# Patient Record
Sex: Female | Born: 1995 | Hispanic: No | Marital: Married | Smoking: Never smoker
Health system: Southern US, Community
[De-identification: ages and names within clinical notes are randomized; demographics above are authoritative.]

## PROBLEM LIST (undated history)

## (undated) DIAGNOSIS — J121 Respiratory syncytial virus pneumonia: Secondary | ICD-10-CM

## (undated) DIAGNOSIS — B360 Pityriasis versicolor: Secondary | ICD-10-CM

## (undated) HISTORY — PX: WISDOM TOOTH EXTRACTION: SHX21

## (undated) HISTORY — DX: Respiratory syncytial virus pneumonia: J12.1

## (undated) HISTORY — DX: Pityriasis versicolor: B36.0

## (undated) HISTORY — PX: KIDNEY SURGERY: SHX687

---
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
## 2013-05-08 ENCOUNTER — Encounter: Payer: Self-pay | Admitting: Gynecology

## 2013-05-08 ENCOUNTER — Ambulatory Visit (INDEPENDENT_AMBULATORY_CARE_PROVIDER_SITE_OTHER): Payer: Commercial Managed Care - PPO | Admitting: Gynecology

## 2013-05-08 DIAGNOSIS — N912 Amenorrhea, unspecified: Secondary | ICD-10-CM

## 2013-05-08 LAB — HCG, SERUM, QUALITATIVE: Preg, Serum: NEGATIVE

## 2013-05-08 NOTE — Patient Instructions (Signed)
Followup for blood pregnancy test results. If negative then restart your birth control pills this Sunday.

## 2013-05-08 NOTE — Progress Notes (Signed)
Patient presents having missed her last menses. LMP 04/01/2013. Was supposed to start beginning of this week. Had been on oral contraceptives but did not restart her pack. 2 home pregnancy test negative. No bleeding or cramping.  Exam was Administrator, [employer] vagina normal. Cervix normal. Uterus retroverted grossly normal in size but mobile nontender. Adnexa without masses or tenderness.  Assessment and plan: Missed menses denies any missed BCPs. Using condoms as backup. Check qualitative hCG. Assuming negative then restart new pack of pills this Sunday. Stressed the need to continue condoms to help decrease STD and backup contraception.

## 2013-06-21 ENCOUNTER — Ambulatory Visit (INDEPENDENT_AMBULATORY_CARE_PROVIDER_SITE_OTHER): Payer: Commercial Managed Care - PPO | Admitting: Gynecology

## 2013-06-21 ENCOUNTER — Encounter: Payer: Self-pay | Admitting: Gynecology

## 2013-06-21 VITALS — BP 120/80 | Ht 59.0 in | Wt 179.0 lb

## 2013-06-21 DIAGNOSIS — Z113 Encounter for screening for infections with a predominantly sexual mode of transmission: Secondary | ICD-10-CM

## 2013-06-21 DIAGNOSIS — Z01419 Encounter for gynecological examination (general) (routine) without abnormal findings: Secondary | ICD-10-CM

## 2013-06-21 LAB — URINALYSIS W MICROSCOPIC + REFLEX CULTURE
BILIRUBIN URINE: NEGATIVE
CASTS: NONE SEEN
Crystals: NONE SEEN
GLUCOSE, UA: NEGATIVE mg/dL
Ketones, ur: NEGATIVE mg/dL
Nitrite: POSITIVE — AB
PROTEIN: NEGATIVE mg/dL
Specific Gravity, Urine: 1.015 (ref 1.005–1.030)
Urobilinogen, UA: 0.2 mg/dL (ref 0.0–1.0)
pH: 6 (ref 5.0–8.0)

## 2013-06-21 MED ORDER — NORETHIN ACE-ETH ESTRAD-FE 1-20 MG-MCG PO TABS: 1.0000 | ORAL_TABLET | Freq: Every day | ORAL | Status: DC

## 2013-06-21 NOTE — Patient Instructions (Signed)
Followup in one year for annual exam, sooner as needed. 

## 2013-06-21 NOTE — Progress Notes (Signed)
Andres EgeSarah C Frandock May 16, 1995 [id number]        18 y.o.  G0P0 for annual exam.  Doing well without complaints.  Past medical history,surgical history, problem list, medications, allergies, family history and social history were all reviewed and documented in the EPIC chart.  ROS:  Performed and pertinent positives and negatives are included in the history, assessment and plan .  Exam: Kim assistant Filed Vitals:   06/21/13 1554  BP: 120/80  Height: 4\' 11"  (1.499 m)  Weight: 179 lb (81.194 kg)   General appearance  Normal Skin grossly normal Head/Neck normal with no cervical or supraclavicular adenopathy thyroid normal Lungs  clear Cardiac RR, without RMG Abdominal  soft, nontender, without masses, organomegaly or hernia Breasts  examined lying and sitting without masses, retractions, discharge or axillary adenopathy. Pelvic  Ext/BUS/vagina  Normal  Cervix  Normal. GC/Chlamydia screen done  Uterus  anteverted, normal size, shape and contour, midline and mobile nontender   Adnexa  Without masses or tenderness    Anus and perineum  Normal      Assessment/Plan:  18 y.o. G0P0 female for annual exam regular menses, oral contraceptives..   1. Patient doing well on her Loestrin 120 equivalent. Refill x1 year provided. 2. STD screening. GC/Chlamydia done. Need to continue condoms regardless to help decrease STD risk reviewed. 3. Gardasil series received. 4. Pap smear deferred until 21 per current screening guidelines. 5. Breast health. SBE monthly reviewed. 6. Health maintenance. No routine lab work done other than urinalysis. Followup in one year, sooner as needed.   Note: This document was prepared with digital dictation and possible smart phrase technology. Any transcriptional errors that result from this process are unintentional.   Dara LordsFONTAINE,Arlie Posch P MD, 4:54 PM 06/21/2013

## 2013-06-22 LAB — GC/CHLAMYDIA PROBE AMP
CT Probe RNA: NEGATIVE
GC PROBE AMP APTIMA: NEGATIVE

## 2013-06-24 ENCOUNTER — Other Ambulatory Visit: Payer: Self-pay | Admitting: Gynecology

## 2013-06-24 DIAGNOSIS — N390 Urinary tract infection, site not specified: Secondary | ICD-10-CM

## 2013-06-24 LAB — URINE CULTURE: Colony Count: >=100000

## 2013-06-24 MED ORDER — SULFAMETHOXAZOLE-TMP DS 800-160 MG PO TABS: 1.0000 | ORAL_TABLET | Freq: Two times a day (BID) | ORAL | Status: DC

## 2013-07-19 ENCOUNTER — Other Ambulatory Visit: Payer: Self-pay

## 2013-07-19 DIAGNOSIS — Z01419 Encounter for gynecological examination (general) (routine) without abnormal findings: Secondary | ICD-10-CM

## 2013-07-19 MED ORDER — NORETHIN ACE-ETH ESTRAD-FE 1-20 MG-MCG PO TABS
1.0000 | ORAL_TABLET | Freq: Every day | ORAL | Status: DC
Start: 2013-07-19 — End: 2013-08-16

## 2013-08-16 ENCOUNTER — Other Ambulatory Visit: Payer: Self-pay

## 2013-08-16 DIAGNOSIS — Z01419 Encounter for gynecological examination (general) (routine) without abnormal findings: Secondary | ICD-10-CM

## 2013-08-16 MED ORDER — NORETHIN ACE-ETH ESTRAD-FE 1-20 MG-MCG PO TABS: 1.0000 | ORAL_TABLET | Freq: Every day | ORAL | Status: DC

## 2013-09-13 ENCOUNTER — Other Ambulatory Visit: Payer: Self-pay

## 2013-09-13 DIAGNOSIS — Z01419 Encounter for gynecological examination (general) (routine) without abnormal findings: Secondary | ICD-10-CM

## 2013-09-13 MED ORDER — NORETHIN ACE-ETH ESTRAD-FE 1-20 MG-MCG PO TABS: 1.0000 | ORAL_TABLET | Freq: Every day | ORAL | Status: DC

## 2013-10-11 ENCOUNTER — Other Ambulatory Visit: Payer: Self-pay

## 2013-10-11 DIAGNOSIS — Z01419 Encounter for gynecological examination (general) (routine) without abnormal findings: Secondary | ICD-10-CM

## 2013-10-11 MED ORDER — NORETHIN ACE-ETH ESTRAD-FE 1-20 MG-MCG PO TABS: 1.0000 | ORAL_TABLET | Freq: Every day | ORAL | Status: DC

## 2013-11-11 ENCOUNTER — Other Ambulatory Visit: Payer: Self-pay

## 2013-11-11 DIAGNOSIS — Z01419 Encounter for gynecological examination (general) (routine) without abnormal findings: Secondary | ICD-10-CM

## 2013-11-11 MED ORDER — NORETHIN ACE-ETH ESTRAD-FE 1-20 MG-MCG PO TABS: 1.0000 | ORAL_TABLET | Freq: Every day | ORAL | Status: DC

## 2014-07-18 ENCOUNTER — Encounter: Payer: Self-pay | Admitting: Gynecology

## 2014-07-18 ENCOUNTER — Ambulatory Visit (INDEPENDENT_AMBULATORY_CARE_PROVIDER_SITE_OTHER): Payer: Commercial Managed Care - PPO | Admitting: Gynecology

## 2014-07-18 VITALS — BP 124/80 | Ht 59.0 in | Wt 189.0 lb

## 2014-07-18 DIAGNOSIS — Z01419 Encounter for gynecological examination (general) (routine) without abnormal findings: Secondary | ICD-10-CM | POA: Diagnosis not present

## 2014-07-18 DIAGNOSIS — Z113 Encounter for screening for infections with a predominantly sexual mode of transmission: Secondary | ICD-10-CM

## 2014-07-18 DIAGNOSIS — R21 Rash and other nonspecific skin eruption: Secondary | ICD-10-CM

## 2014-07-18 LAB — COMPREHENSIVE METABOLIC PANEL
ALBUMIN: 4.1 g/dL (ref 3.5–5.2)
ALK PHOS: 57 U/L (ref 39–117)
ALT: <8 U/L (ref 0–35)
AST: 11 U/L (ref 0–37)
BILIRUBIN TOTAL: 0.4 mg/dL (ref 0.2–1.2)
BUN: 12 mg/dL (ref 6–23)
CO2: 23 mEq/L (ref 19–32)
Calcium: 9 mg/dL (ref 8.4–10.5)
Chloride: 105 mEq/L (ref 96–112)
Creat: 0.63 mg/dL (ref 0.50–1.10)
GLUCOSE: 93 mg/dL (ref 70–99)
Potassium: 4.6 mEq/L (ref 3.5–5.3)
Sodium: 139 mEq/L (ref 135–145)
Total Protein: 6.7 g/dL (ref 6.0–8.3)

## 2014-07-18 LAB — CBC WITH DIFFERENTIAL/PLATELET
Basophils Absolute: 0 10*3/uL (ref 0.0–0.1)
Basophils Relative: 0 % (ref 0–1)
EOS ABS: 0.3 10*3/uL (ref 0.0–0.7)
EOS PCT: 3 % (ref 0–5)
HCT: 43.4 % (ref 36.0–46.0)
Hemoglobin: 14.3 g/dL (ref 12.0–15.0)
LYMPHS ABS: 2.3 10*3/uL (ref 0.7–4.0)
Lymphocytes Relative: 27 % (ref 12–46)
MCH: 25.6 pg — ABNORMAL LOW (ref 26.0–34.0)
MCHC: 32.9 g/dL (ref 30.0–36.0)
MCV: 77.8 fL — ABNORMAL LOW (ref 78.0–100.0)
MONOS PCT: 4 % (ref 3–12)
MPV: 9.5 fL (ref 8.6–12.4)
Monocytes Absolute: 0.3 10*3/uL (ref 0.1–1.0)
Neutro Abs: 5.5 10*3/uL (ref 1.7–7.7)
Neutrophils Relative %: 66 % (ref 43–77)
Platelets: 378 10*3/uL (ref 150–400)
RBC: 5.58 MIL/uL — ABNORMAL HIGH (ref 3.87–5.11)
RDW: 14.6 % (ref 11.5–15.5)
WBC: 8.4 10*3/uL (ref 4.0–10.5)

## 2014-07-18 LAB — WET PREP FOR TRICH, YEAST, CLUE
CLUE CELLS WET PREP: NONE SEEN
TRICH WET PREP: NONE SEEN

## 2014-07-18 MED ORDER — NORETHIN ACE-ETH ESTRAD-FE 1-20 MG-MCG PO TABS: 1.0000 | ORAL_TABLET | Freq: Every day | ORAL | Status: DC

## 2014-07-18 MED ORDER — FLUCONAZOLE 150 MG PO TABS: 150.0000 mg | ORAL_TABLET | Freq: Every day | ORAL | Status: DC

## 2014-07-18 MED ORDER — BETAMETHASONE DIPROPIONATE 0.05 % EX CREA: TOPICAL_CREAM | Freq: Two times a day (BID) | CUTANEOUS | Status: DC

## 2014-07-18 NOTE — Progress Notes (Signed)
Andres EgeSarah C Frandock 06-27-1995 [id number]        19 y.o.  G0P0 for annual exam.  Several issues noted below.  Past medical history,surgical history, problem list, medications, allergies, family history and social history were all reviewed and documented as reviewed in the EPIC chart.  ROS:  Performed with pertinent positives and negatives included in the history, assessment and plan.   Additional significant findings :  none   Exam: [employer] [facility] personassistant Filed Vitals:   07/18/14 1054  BP: 124/80  Height: 4\' 11"  (1.499 m)  Weight: 189 lb (85.73 kg)   General appearance:  Normal affect, orientation and appearance. Skin: Grossly normal HEENT: Without gross lesions.  No cervical or supraclavicular adenopathy. Thyroid normal.  Lungs:  Clear without wheezing, rales or rhonchi Cardiac: RR, without RMG Abdominal:  Soft, nontender, without masses, guarding, rebound, organomegaly or hernia Breasts:  Examined lying and sitting without masses, retractions, discharge or axillary adenopathy. Pelvic:  Ext/BUS/vagina with raised erythematous rash right groin crease. White vaginal discharge noted.  Cervix normal. GC/Chlamydia  Uterus axial, normal size, shape and contour, midline and mobile nontender   Adnexa  Without masses or tenderness    Anus and perineum  Normal     Assessment/Plan:  19 y.o. G0P0 female for annual exam with regular menses, oral contraceptives.   1. Birth control.  Patient continues on Loestrin 120 equivalent doing well. Did have questions about alternatives. I reviewed all forms of birth control with her to include pill patch ring Nexplanon Depo-Provera IUDs.  She has 2 friends using the Mirena IUD doing well with this. I reviewed the Mirena IUD with her, the insertional process, the risks and side effects. Issues of infection in the liver's patients also reviewed. Literature provided and patient is going to decide if she wants to pursue this. I did refill her Loestrin 120 equivalent 1  year. 2. Vulvar irritation. Well-defined erythematous rash right groin crease. Does have white discharge with positive yeast on wet prep. We'll treat with Diflucan 150 mg 3 days to cover skin involvement and Diprolene 0.05% cream twice a day 1-2 weeks. Assuming rash totally resolves patient will follow expectantly. If rash persists she knows to follow up for further evaluation. 3. STD screening. GC/chlamydia screen done. No known exposure. 4. Pap smear. Will start next year at age 19. 375. Gardasil series reportedly received. 6. Breast health. SBE monthly reviewed. 7. Health maintenance. Baseline CBC company has a metabolic panel urinalysis ordered. Follow up in one year, sooner if she decides to pursue Mirena IUD.     Dara LordsFONTAINE,Morton Simson P MD, 11:23 AM 07/18/2014

## 2014-07-18 NOTE — Patient Instructions (Signed)
Take the Diflucan pill daily for 3 days. Apply the steroid cream twice daily to the rash area. If the rash persists then call me. If it totally resolves them we will follow.  Follow up if you decide to pursue the IUD.  You may obtain a copy of any labs that were done today by logging onto MyChart as outlined in the instructions provided with your AVS (after visit summary). The office will not call with normal lab results but certainly if there are any significant abnormalities then we will contact you.   Health Maintenance, Female A healthy lifestyle and preventative care can promote health and wellness.  Maintain regular health, dental, and eye exams.  Eat a healthy diet. Foods like vegetables, fruits, whole grains, low-fat dairy products, and lean protein foods contain the nutrients you need without too many calories. Decrease your intake of foods high in solid fats, added sugars, and salt. Get information about a proper diet from your caregiver, if necessary.  Regular physical exercise is one of the most important things you can do for your health. Most adults should get at least 150 minutes of moderate-intensity exercise (any activity that increases your heart rate and causes you to sweat) each week. In addition, most adults need muscle-strengthening exercises on 2 or more days a week.   Maintain a healthy weight. The body mass index (BMI) is a screening tool to identify possible weight problems. It provides an estimate of body fat based on height and weight. Your caregiver can help determine your BMI, and can help you achieve or maintain a healthy weight. For adults 20 years and older:  A BMI below 18.5 is considered underweight.  A BMI of 18.5 to 24.9 is normal.  A BMI of 25 to 29.9 is considered overweight.  A BMI of 30 and above is considered obese.  Maintain normal blood lipids and cholesterol by exercising and minimizing your intake of saturated fat. Eat a balanced diet with  plenty of fruits and vegetables. Blood tests for lipids and cholesterol should begin at age 28 and be repeated every 5 years. If your lipid or cholesterol levels are high, you are over 50, or you are a high risk for heart disease, you may need your cholesterol levels checked more frequently.Ongoing high lipid and cholesterol levels should be treated with medicines if diet and exercise are not effective.  If you smoke, find out from your caregiver how to quit. If you do not use tobacco, do not start.  Lung cancer screening is recommended for adults aged 3 80 years who are at high risk for developing lung cancer because of a history of smoking. Yearly low-dose computed tomography (CT) is recommended for people who have at least a 30-pack-year history of smoking and are a current smoker or have quit within the past 15 years. A pack year of smoking is smoking an average of 1 pack of cigarettes a day for 1 year (for example: 1 pack a day for 30 years or 2 packs a day for 15 years). Yearly screening should continue until the smoker has stopped smoking for at least 15 years. Yearly screening should also be stopped for people who develop a health problem that would prevent them from having lung cancer treatment.  If you are pregnant, do not drink alcohol. If you are breastfeeding, be very cautious about drinking alcohol. If you are not pregnant and choose to drink alcohol, do not exceed 1 drink per day. One drink is considered  to be 12 ounces (355 mL) of beer, 5 ounces (148 mL) of wine, or 1.5 ounces (44 mL) of liquor.  Avoid use of street drugs. Do not share needles with anyone. Ask for help if you need support or instructions about stopping the use of drugs.  High blood pressure causes heart disease and increases the risk of stroke. Blood pressure should be checked at least every 1 to 2 years. Ongoing high blood pressure should be treated with medicines, if weight loss and exercise are not effective.  If you  are 58 to 21 years old, ask your caregiver if you should take aspirin to prevent strokes.  Diabetes screening involves taking a blood sample to check your fasting blood sugar level. This should be done once every 3 years, after age 53, if you are within normal weight and without risk factors for diabetes. Testing should be considered at a younger age or be carried out more frequently if you are overweight and have at least 1 risk factor for diabetes.  Breast cancer screening is essential preventative care for women. You should practice "breast self-awareness." This means understanding the normal appearance and feel of your breasts and may include breast self-examination. Any changes detected, no matter how small, should be reported to a caregiver. Women in their 33s and 30s should have a clinical breast exam (CBE) by a caregiver as part of a regular health exam every 1 to 3 years. After age 56, women should have a CBE every year. Starting at age 51, women should consider having a mammogram (breast X-ray) every year. Women who have a family history of breast cancer should talk to their caregiver about genetic screening. Women at a high risk of breast cancer should talk to their caregiver about having an MRI and a mammogram every year.  Breast cancer gene (BRCA)-related cancer risk assessment is recommended for women who have family members with BRCA-related cancers. BRCA-related cancers include breast, ovarian, tubal, and peritoneal cancers. Having family members with these cancers may be associated with an increased risk for harmful changes (mutations) in the breast cancer genes BRCA1 and BRCA2. Results of the assessment will determine the need for genetic counseling and BRCA1 and BRCA2 testing.  The Pap test is a screening test for cervical cancer. Women should have a Pap test starting at age 30. Between ages 25 and 29, Pap tests should be repeated every 2 years. Beginning at age 37, you should have a Pap  test every 3 years as long as the past 3 Pap tests have been normal. If you had a hysterectomy for a problem that was not cancer or a condition that could lead to cancer, then you no longer need Pap tests. If you are between ages 20 and 67, and you have had normal Pap tests going back 10 years, you no longer need Pap tests. If you have had past treatment for cervical cancer or a condition that could lead to cancer, you need Pap tests and screening for cancer for at least 20 years after your treatment. If Pap tests have been discontinued, risk factors (such as a new sexual partner) need to be reassessed to determine if screening should be resumed. Some women have medical problems that increase the chance of getting cervical cancer. In these cases, your caregiver may recommend more frequent screening and Pap tests.  The human papillomavirus (HPV) test is an additional test that may be used for cervical cancer screening. The HPV test looks for the virus  that can cause the cell changes on the cervix. The cells collected during the Pap test can be tested for HPV. The HPV test could be used to screen women aged 14 years and older, and should be used in women of any age who have unclear Pap test results. After the age of 44, women should have HPV testing at the same frequency as a Pap test.  Colorectal cancer can be detected and often prevented. Most routine colorectal cancer screening begins at the age of 77 and continues through age 34. However, your caregiver may recommend screening at an earlier age if you have risk factors for colon cancer. On a yearly basis, your caregiver may provide home test kits to check for hidden blood in the stool. Use of a small camera at the end of a tube, to directly examine the colon (sigmoidoscopy or colonoscopy), can detect the earliest forms of colorectal cancer. Talk to your caregiver about this at age 60, when routine screening begins. Direct examination of the colon should be  repeated every 5 to 10 years through age 78, unless early forms of pre-cancerous polyps or small growths are found.  Hepatitis C blood testing is recommended for all people born from [birth year] through 1967 and any individual with known risks for hepatitis C.  Practice safe sex. Use condoms and avoid high-risk sexual practices to reduce the spread of sexually transmitted infections (STIs). Sexually active women aged 35 and younger should be checked for Chlamydia, which is a common sexually transmitted infection. Older women with new or multiple partners should also be tested for Chlamydia. Testing for other STIs is recommended if you are sexually active and at increased risk.  Osteoporosis is a disease in which the bones lose minerals and strength with aging. This can result in serious bone fractures. The risk of osteoporosis can be identified using a bone density scan. Women ages 20 and over and women at risk for fractures or osteoporosis should discuss screening with their caregivers. Ask your caregiver whether you should be taking a calcium supplement or vitamin D to reduce the rate of osteoporosis.  Menopause can be associated with physical symptoms and risks. Hormone replacement therapy is available to decrease symptoms and risks. You should talk to your caregiver about whether hormone replacement therapy is right for you.  Use sunscreen. Apply sunscreen liberally and repeatedly throughout the day. You should seek shade when your shadow is shorter than you. Protect yourself by wearing long sleeves, pants, a wide-brimmed hat, and sunglasses year round, whenever you are outdoors.  Notify your caregiver of new moles or changes in moles, especially if there is a change in shape or color. Also notify your caregiver if a mole is larger than the size of a pencil eraser.  Stay current with your immunizations. Document Released: 11/08/2010 Document Revised: 08/20/2012 Document Reviewed: 11/08/2010 [facility]  Patient Information 2014 [place].

## 2014-07-19 LAB — URINALYSIS W MICROSCOPIC + REFLEX CULTURE
Bilirubin Urine: NEGATIVE
CRYSTALS: NONE SEEN
Casts: NONE SEEN
GLUCOSE, UA: NEGATIVE mg/dL
Ketones, ur: NEGATIVE mg/dL
Nitrite: POSITIVE — AB
Protein, ur: NEGATIVE mg/dL
Specific Gravity, Urine: 1.023 (ref 1.005–1.030)
Urobilinogen, UA: 0.2 mg/dL (ref 0.0–1.0)
pH: 5.5 (ref 5.0–8.0)

## 2014-07-19 LAB — GC/CHLAMYDIA PROBE AMP
CT PROBE, AMP APTIMA: NEGATIVE
GC Probe RNA: NEGATIVE

## 2014-07-21 ENCOUNTER — Other Ambulatory Visit: Payer: Self-pay | Admitting: Gynecology

## 2014-07-21 LAB — URINE CULTURE: Colony Count: >=100000

## 2014-07-21 MED ORDER — SULFAMETHOXAZOLE-TRIMETHOPRIM 800-160 MG PO TABS: 1.0000 | ORAL_TABLET | Freq: Two times a day (BID) | ORAL | Status: DC

## 2014-09-30 ENCOUNTER — Other Ambulatory Visit: Payer: Self-pay | Admitting: *Deleted

## 2014-09-30 DIAGNOSIS — R8271 Bacteriuria: Secondary | ICD-10-CM

## 2014-10-03 ENCOUNTER — Other Ambulatory Visit: Payer: Commercial Managed Care - PPO

## 2014-10-03 DIAGNOSIS — R8271 Bacteriuria: Secondary | ICD-10-CM

## 2014-10-04 LAB — URINALYSIS W MICROSCOPIC + REFLEX CULTURE
BILIRUBIN URINE: NEGATIVE
CASTS: NONE SEEN
Crystals: NONE SEEN
Glucose, UA: NEGATIVE mg/dL
Ketones, ur: NEGATIVE mg/dL
LEUKOCYTES UA: NEGATIVE
Nitrite: POSITIVE — AB
PROTEIN: NEGATIVE mg/dL
SPECIFIC GRAVITY, URINE: 1.014 (ref 1.005–1.030)
SQUAMOUS EPITHELIAL / LPF: NONE SEEN
UROBILINOGEN UA: 0.2 mg/dL (ref 0.0–1.0)
pH: 5.5 (ref 5.0–8.0)

## 2014-10-06 LAB — URINE CULTURE: Colony Count: >=100000

## 2014-10-07 ENCOUNTER — Other Ambulatory Visit: Payer: Self-pay | Admitting: *Deleted

## 2014-10-07 MED ORDER — CIPROFLOXACIN HCL 250 MG PO TABS: 250.0000 mg | ORAL_TABLET | Freq: Two times a day (BID) | ORAL | Status: DC

## 2015-01-19 ENCOUNTER — Telehealth: Payer: Self-pay

## 2015-01-19 NOTE — Telephone Encounter (Signed)
Patient called in voice mail. She states "everytime I come to the gynecologist I have a UTI".  She complained of yesterday her "kidney was swollen, warm in the spot where my right kidney is".  She asked if she should go to urologist or come here.  I called her back and left detailed message in voice mail per [facility] access note. I advised her that if she has urologist certainly that would be fine to go and see the urologist. I told her if she is not established with urologist the issue is she will need a referral and sometimes new patient appointments can take a few weeks.  In that case she could come to [facility] for assessment. I advised her Dr. Adaline Sill off today but Dr. Glenetta Hew could see her.  I told her to call back and speak with the appointment desk if she would like to come in or to call me back if any questions.

## 2015-01-23 ENCOUNTER — Encounter: Payer: Self-pay | Admitting: Gynecology

## 2015-01-23 ENCOUNTER — Ambulatory Visit (INDEPENDENT_AMBULATORY_CARE_PROVIDER_SITE_OTHER): Payer: Commercial Managed Care - PPO | Admitting: Gynecology

## 2015-01-23 VITALS — BP 118/76

## 2015-01-23 DIAGNOSIS — R3915 Urgency of urination: Secondary | ICD-10-CM

## 2015-01-23 DIAGNOSIS — N898 Other specified noninflammatory disorders of vagina: Secondary | ICD-10-CM | POA: Diagnosis not present

## 2015-01-23 LAB — WET PREP FOR TRICH, YEAST, CLUE: Trich, Wet Prep: NONE SEEN

## 2015-01-23 LAB — URINALYSIS W MICROSCOPIC + REFLEX CULTURE
BILIRUBIN URINE: NEGATIVE
CRYSTALS: NONE SEEN [HPF]
Casts: NONE SEEN [LPF]
GLUCOSE, UA: NEGATIVE
KETONES UR: NEGATIVE
LEUKOCYTES UA: NEGATIVE
Nitrite: NEGATIVE
PH: 7 (ref 5.0–8.0)
Protein, ur: NEGATIVE
Specific Gravity, Urine: 1.02 (ref 1.001–1.035)
Yeast: NONE SEEN [HPF]

## 2015-01-23 MED ORDER — METRONIDAZOLE 500 MG PO TABS: 500.0000 mg | ORAL_TABLET | Freq: Two times a day (BID) | ORAL | Status: DC

## 2015-01-23 MED ORDER — FLUCONAZOLE 150 MG PO TABS
150.0000 mg | ORAL_TABLET | Freq: Once | ORAL | Status: DC
Start: 2015-01-23 — End: 2015-02-17

## 2015-01-23 NOTE — Progress Notes (Signed)
LORMA HEATER 11-01-1995 [id number]        19 y.o.  G0P0 Patient presents with several days of urinary urgency and urinary odor. Also some low back pain. No fever or chills nausea vomiting frequency or dysuria. Seems to happen frequently and she wonders whether she is having frequent urinary tract infections. She has a friend who takes postcoital antibiotics wonders whether she should do this.  Past medical history,surgical history, problem list, medications, allergies, family history and social history were all reviewed and documented in the EPIC chart.  Directed ROS with pertinent positives and negatives documented in the history of present illness/assessment and plan.  Exam: Kim assistant Filed Vitals:   01/23/15 1529  BP: 118/76   General appearance:  Normal Spine straight without CVA tenderness Abdomen soft nontender without masses guarding rebound Pelvic external BUS vagina with white frothy discharge. Cervix normal. Uterus normal size midline mobile nontender. Adnexa without masses or tenderness.  Assessment/Plan:  19 y.o. G0P0 with symptoms as above. Wet prep does show yeast and few clue cells. Will cover for yeast and bacterial vaginosis with Diflucan 150 mg 1 and Flagyl 500 mg twice a day 7, alcohol avoidance reviewed. Urinalysis shows few bacteria. Will await culture and treat if positive. If urinary symptoms persist and urine culture negative will refer to urology rule out interstitial cystitis. She does have a history also of kidney/bladder surgery as a child. If culture positive then we'll treat for UTI and try postcoital antibiotics to see if this doesn't suppress recurrences. If she fails this we will refer to urology.    Dara Lords MD, 3:57 PM 01/23/2015

## 2015-01-23 NOTE — Patient Instructions (Signed)
Take the one Diflucan pill. Take the Flagyl medication twice daily for 7 days. Avoid alcohol while taking. Call if your urinary symptoms continue and I may refer you to urology.

## 2015-01-25 LAB — URINE CULTURE: Colony Count: >=100000

## 2015-01-26 ENCOUNTER — Other Ambulatory Visit: Payer: Self-pay | Admitting: Gynecology

## 2015-01-26 MED ORDER — NITROFURANTOIN MONOHYD MACRO 100 MG PO CAPS: ORAL_CAPSULE | ORAL | Status: DC

## 2015-02-17 ENCOUNTER — Ambulatory Visit (INDEPENDENT_AMBULATORY_CARE_PROVIDER_SITE_OTHER): Payer: Commercial Managed Care - PPO | Admitting: Gynecology

## 2015-02-17 ENCOUNTER — Encounter: Payer: Self-pay | Admitting: Gynecology

## 2015-02-17 VITALS — BP 120/76

## 2015-02-17 DIAGNOSIS — N926 Irregular menstruation, unspecified: Secondary | ICD-10-CM | POA: Diagnosis not present

## 2015-02-17 DIAGNOSIS — N390 Urinary tract infection, site not specified: Secondary | ICD-10-CM

## 2015-02-17 LAB — URINALYSIS W MICROSCOPIC + REFLEX CULTURE
BILIRUBIN URINE: NEGATIVE
Bacteria, UA: NONE SEEN [HPF]
CRYSTALS: NONE SEEN [HPF]
Casts: NONE SEEN [LPF]
GLUCOSE, UA: NEGATIVE
Hgb urine dipstick: NEGATIVE
Ketones, ur: NEGATIVE
LEUKOCYTES UA: NEGATIVE
Nitrite: NEGATIVE
Protein, ur: NEGATIVE
RBC / HPF: NONE SEEN RBC/HPF (ref <=2)
SPECIFIC GRAVITY, URINE: 1.01 (ref 1.001–1.035)
WBC UA: NONE SEEN WBC/HPF (ref <=5)
Yeast: NONE SEEN [HPF]
pH: 7 (ref 5.0–8.0)

## 2015-02-17 MED ORDER — NORETHINDRONE-ETH ESTRADIOL 1-35 MG-MCG PO TABS: 1.0000 | ORAL_TABLET | Freq: Every day | ORAL | Status: DC

## 2015-02-17 NOTE — Patient Instructions (Signed)
Start on the new birth control pills. If irregular bleeding continues after the second pack call the office. If it regulates and everything is fine then follow up when you're due for your annual exam.

## 2015-02-17 NOTE — Progress Notes (Signed)
Brooke Haley Aug 28, 1995 [id number]        19 y.o.  G0P0 presents complaining of breakthrough bleeding into the third week of her birth control pills for 3 months. Does have regular menses when do but start spotting and having breakthrough bleeding the third week. No pain cramping or other symptoms. Recently treated for UTI with Escherichia coli. Is going to start a postcoital antibiotic regiment.  Past medical history,surgical history, problem list, medications, allergies, family history and social history were all reviewed and documented in the EPIC chart.  Directed ROS with pertinent positives and negatives documented in the history of present illness/assessment and plan.  Exam: Kim assistant Filed Vitals:   02/17/15 1538  BP: 120/76   General appearance:  Normal Abdomen soft nontender without masses guarding rebound Pelvic external BUS vagina normal. Cervix normal. Uterus grossly normal midline mobile nontender. Adnexa without gross masses or tenderness  Assessment/Plan:  19 y.o. G0P0 with breakthrough bleeding third week into the cycle 3 months. Will increase to a higher estrogen dose pill and see if we do not eliminate this. We'll go with Ortho-Novum 1/35 equivalent. Patient will call if she continues to have breakthrough bleeding after 2 packs. Otherwise assuming she does well then she'll follow up when she is due for her annual exam in March 2017.  Will check urine analysis today for test of cure from her recent urinary tract infection.    Dara Lords MD, 3:50 PM 02/17/2015

## 2015-07-21 ENCOUNTER — Encounter: Payer: Commercial Managed Care - PPO | Admitting: Gynecology

## 2015-07-23 ENCOUNTER — Ambulatory Visit (INDEPENDENT_AMBULATORY_CARE_PROVIDER_SITE_OTHER): Payer: Managed Care, Other (non HMO) | Admitting: Gynecology

## 2015-07-23 ENCOUNTER — Encounter: Payer: Self-pay | Admitting: Gynecology

## 2015-07-23 VITALS — BP 116/70 | Ht 60.0 in | Wt 196.0 lb

## 2015-07-23 DIAGNOSIS — Z01419 Encounter for gynecological examination (general) (routine) without abnormal findings: Secondary | ICD-10-CM

## 2015-07-23 DIAGNOSIS — Z113 Encounter for screening for infections with a predominantly sexual mode of transmission: Secondary | ICD-10-CM

## 2015-07-23 LAB — URINALYSIS W MICROSCOPIC + REFLEX CULTURE
Bilirubin Urine: NEGATIVE
CASTS: NONE SEEN [LPF]
Crystals: NONE SEEN [HPF]
Glucose, UA: NEGATIVE
Ketones, ur: NEGATIVE
LEUKOCYTES UA: NEGATIVE
NITRITE: NEGATIVE
Protein, ur: NEGATIVE
SPECIFIC GRAVITY, URINE: 1.02 (ref 1.001–1.035)
YEAST: NONE SEEN [HPF]
pH: 6.5 (ref 5.0–8.0)

## 2015-07-23 LAB — CBC WITH DIFFERENTIAL/PLATELET
BASOS PCT: 0 % (ref 0–1)
Basophils Absolute: 0 10*3/uL (ref 0.0–0.1)
EOS ABS: 0.2 10*3/uL (ref 0.0–0.7)
EOS PCT: 2 % (ref 0–5)
HCT: 42.1 % (ref 36.0–46.0)
HEMOGLOBIN: 13.9 g/dL (ref 12.0–15.0)
Lymphocytes Relative: 31 % (ref 12–46)
Lymphs Abs: 2.4 10*3/uL (ref 0.7–4.0)
MCH: 25.9 pg — AB (ref 26.0–34.0)
MCHC: 33 g/dL (ref 30.0–36.0)
MCV: 78.4 fL (ref 78.0–100.0)
MONO ABS: 0.5 10*3/uL (ref 0.1–1.0)
MONOS PCT: 6 % (ref 3–12)
MPV: 9.5 fL (ref 8.6–12.4)
NEUTROS ABS: 4.8 10*3/uL (ref 1.7–7.7)
Neutrophils Relative %: 61 % (ref 43–77)
PLATELETS: 385 10*3/uL (ref 150–400)
RBC: 5.37 MIL/uL — AB (ref 3.87–5.11)
RDW: 14 % (ref 11.5–15.5)
WBC: 7.9 10*3/uL (ref 4.0–10.5)

## 2015-07-23 LAB — COMPREHENSIVE METABOLIC PANEL
ALT: 9 U/L (ref 6–29)
AST: 14 U/L (ref 10–30)
Albumin: 4.1 g/dL (ref 3.6–5.1)
Alkaline Phosphatase: 45 U/L (ref 33–115)
BILIRUBIN TOTAL: 0.4 mg/dL (ref 0.2–1.2)
BUN: 12 mg/dL (ref 7–25)
CHLORIDE: 104 mmol/L (ref 98–110)
CO2: 22 mmol/L (ref 20–31)
CREATININE: 0.56 mg/dL (ref 0.50–1.10)
Calcium: 9.1 mg/dL (ref 8.6–10.2)
GLUCOSE: 79 mg/dL (ref 65–99)
Potassium: 4.7 mmol/L (ref 3.5–5.3)
SODIUM: 140 mmol/L (ref 135–146)
Total Protein: 7.1 g/dL (ref 6.1–8.1)

## 2015-07-23 MED ORDER — SULFAMETHOXAZOLE-TRIMETHOPRIM 800-160 MG PO TABS: 1.0000 | ORAL_TABLET | Freq: Two times a day (BID) | ORAL | Status: DC

## 2015-07-23 MED ORDER — NORETHINDRONE-ETH ESTRADIOL 1-35 MG-MCG PO TABS: 1.0000 | ORAL_TABLET | Freq: Every day | ORAL | Status: DC

## 2015-07-23 NOTE — Progress Notes (Signed)
    Brooke Haley [birth year] [id number]        20 y.o.  G0P0  for annual exam.  Several issues noted below.  Past medical history,surgical history, problem list, medications, allergies, family history and social history were all reviewed and documented as reviewed in the EPIC chart.  ROS:  Performed with pertinent positives and negatives included in the history, assessment and plan.   Additional significant findings :  none   Exam: Kennon PortelaKim Gardner assistant Filed Vitals:   07/23/15 1240  BP: 116/70  Height: 5' (1.524 m)  Weight: 196 lb (88.905 kg)   General appearance:  Normal affect, orientation and appearance. Skin: Grossly normal HEENT: Without gross lesions.  No cervical or supraclavicular adenopathy. Thyroid normal.  Lungs:  Clear without wheezing, rales or rhonchi Cardiac: RR, without RMG Abdominal:  Soft, nontender, without masses, guarding, rebound, organomegaly or hernia Breasts:  Examined lying and sitting without masses, retractions, discharge or axillary adenopathy. Pelvic:  Ext/BUS/vagina normal  Cervix normal. Pap smear done, GC/chlamydia  Uterus anteverted, normal size, shape and contour, midline and mobile nontender   Adnexa without masses or tenderness    Anus and perineum normal    Assessment/Plan:  20 y.o. G0P0 female for annual exam with regular menses, oral contraceptives.   1. Oral contraceptives. Patient doing well with her Ortho-Novum 1/35 equipment lengths. Wants to continue.  Refill 1 year provided. 2. Intermittent dysuria over the last week or so. Has been pushing cranberry juice. History of UTIs in the past. Urinalysis contaminated but does show white cells and bacteria. Will cover with Septra DS 1 by mouth 3 times a day 3 days. Follow up on culture. 3. Pap smear. First Pap smear done today as she is turned 19 per current screening guidelines. 4. STD screening. GC/Chlamydia done. Need for condoms regardless of birth control pills to help reduce STD  risk. 5. Breast health. SBE monthly reviewed. 6. Gardasil series reportedly received. 7. Health maintenance. CBC, CMP, urinalysis ordered. Follow up in one year, sooner as needed.   Dara LordsFONTAINE,Tabitha Riggins P MD, 1:04 PM 07/23/2015

## 2015-07-23 NOTE — Patient Instructions (Signed)
Take the oral antibiotic twice daily for 3 days.  Follow-up if your symptoms persist, worsen or recur. 

## 2015-07-24 LAB — PAP IG W/ RFLX HPV ASCU

## 2015-07-24 LAB — GC/CHLAMYDIA PROBE AMP
CT PROBE, AMP APTIMA: NOT DETECTED
GC PROBE AMP APTIMA: NOT DETECTED

## 2015-07-25 LAB — URINE CULTURE

## 2015-11-19 ENCOUNTER — Ambulatory Visit: Payer: Managed Care, Other (non HMO) | Admitting: Gynecology

## 2015-12-04 ENCOUNTER — Ambulatory Visit: Payer: Managed Care, Other (non HMO) | Admitting: Gynecology

## 2016-07-05 DIAGNOSIS — R350 Frequency of micturition: Secondary | ICD-10-CM | POA: Diagnosis not present

## 2016-07-10 ENCOUNTER — Other Ambulatory Visit: Payer: Self-pay | Admitting: Gynecology

## 2016-07-13 DIAGNOSIS — B348 Other viral infections of unspecified site: Secondary | ICD-10-CM | POA: Diagnosis not present

## 2016-08-04 ENCOUNTER — Ambulatory Visit (INDEPENDENT_AMBULATORY_CARE_PROVIDER_SITE_OTHER): Payer: BLUE CROSS/BLUE SHIELD | Admitting: Gynecology

## 2016-08-04 ENCOUNTER — Encounter: Payer: Self-pay | Admitting: Gynecology

## 2016-08-04 ENCOUNTER — Other Ambulatory Visit: Payer: Self-pay | Admitting: Gynecology

## 2016-08-04 VITALS — BP 126/80 | Ht 59.25 in | Wt 197.0 lb

## 2016-08-04 DIAGNOSIS — Z01419 Encounter for gynecological examination (general) (routine) without abnormal findings: Secondary | ICD-10-CM

## 2016-08-04 DIAGNOSIS — Z113 Encounter for screening for infections with a predominantly sexual mode of transmission: Secondary | ICD-10-CM | POA: Diagnosis not present

## 2016-08-04 LAB — CBC WITH DIFFERENTIAL/PLATELET
BASOS PCT: 0 %
Basophils Absolute: 0 cells/uL (ref 0–200)
EOS ABS: 112 {cells}/uL (ref 15–500)
Eosinophils Relative: 2 %
HEMATOCRIT: 42.3 % (ref 35.0–45.0)
Hemoglobin: 14 g/dL (ref 11.7–15.5)
LYMPHS ABS: 1232 {cells}/uL (ref 850–3900)
Lymphocytes Relative: 22 %
MCH: 25.9 pg — ABNORMAL LOW (ref 27.0–33.0)
MCHC: 33.1 g/dL (ref 32.0–36.0)
MCV: 78.2 fL — AB (ref 80.0–100.0)
MONO ABS: 728 {cells}/uL (ref 200–950)
MPV: 9.5 fL (ref 7.5–12.5)
Monocytes Relative: 13 %
NEUTROS ABS: 3528 {cells}/uL (ref 1500–7800)
Neutrophils Relative %: 63 %
Platelets: 340 10*3/uL (ref 140–400)
RBC: 5.41 MIL/uL — ABNORMAL HIGH (ref 3.80–5.10)
RDW: 14.3 % (ref 11.0–15.0)
WBC: 5.6 10*3/uL (ref 3.8–10.8)

## 2016-08-04 LAB — GLUCOSE, RANDOM: Glucose, Bld: 88 mg/dL (ref 65–99)

## 2016-08-04 MED ORDER — NORETHINDRONE-ETH ESTRADIOL 1-35 MG-MCG PO TABS: 1.0000 | ORAL_TABLET | Freq: Every day | ORAL | 12 refills | Status: DC

## 2016-08-04 NOTE — Patient Instructions (Signed)

## 2016-08-04 NOTE — Progress Notes (Signed)
    Andres EgeSarah C Frandock April 05, 1996 [id number]        21 y.o.  G0P0 for annual exam.    Past medical history,surgical history, problem list, medications, allergies, family history and social history were all reviewed and documented as reviewed in the EPIC chart.  ROS:  Performed with pertinent positives and negatives included in the history, assessment and plan.   Additional significant findings :  None   Exam: Biomedical scientistBlanca assistant Vitals:   08/04/16 1538  BP: 126/80  Weight: 197 lb (89.4 kg)  Height: 4' 11.25" (1.505 m)   Body mass index is 39.45 kg/m.  General appearance:  Normal affect, orientation and appearance. Skin: Grossly normal HEENT: Without gross lesions.  No cervical or supraclavicular adenopathy. Thyroid normal.  Lungs:  Clear without wheezing, rales or rhonchi Cardiac: RR, without RMG Abdominal:  Soft, nontender, without masses, guarding, rebound, organomegaly or hernia Breasts:  Examined lying and sitting without masses, retractions, discharge or axillary adenopathy. Pelvic:  Ext, BUS, Vagina: Normal  Cervix: Normal. GC/Chlamydia  Uterus: Anteverted, normal size, shape and contour, midline and mobile nontender   Adnexa: Without masses or tenderness    Anus and perineum: Normal    Assessment/Plan:  21 y.o. G0P0 female for annual exam with regular menses, oral contraceptives.   1. Continues on Ortho-Novum 1/35 equivalent. Doing well and wants to continue. Refill 1 year provided. 2. Pap smear 2017 normal. No history of abnormal Pap smears. Plan repeat Pap smear at 3 year interval per current screening guidelines. 3. STD screening. GC/Chlamydia done at her request. No known exposure. Declined serum screening. Condoms regardless reviewed. 4. Breast health. SBE monthly reviewed. 5. Gardasil series reportedly received. 6. Health maintenance. Baseline CBC, glucose, urinalysis ordered. Follow up 1 year, sooner as needed   Dara LordsFONTAINE,Eamon Tantillo P MD, 3:57 PM  08/04/2016

## 2016-08-05 LAB — URINALYSIS W MICROSCOPIC + REFLEX CULTURE
Bilirubin Urine: NEGATIVE
CRYSTALS: NONE SEEN [HPF]
Casts: NONE SEEN [LPF]
Glucose, UA: NEGATIVE
KETONES UR: NEGATIVE
NITRITE: POSITIVE — AB
PH: 5.5 (ref 5.0–8.0)
SQUAMOUS EPITHELIAL / LPF: NONE SEEN [HPF] (ref <=5)
Specific Gravity, Urine: 1.015 (ref 1.001–1.035)
WBC, UA: >60 WBC/HPF — AB (ref <=5)
Yeast: NONE SEEN [HPF]

## 2016-08-05 LAB — GC/CHLAMYDIA PROBE AMP
CT PROBE, AMP APTIMA: NOT DETECTED
GC PROBE AMP APTIMA: NOT DETECTED

## 2016-08-07 LAB — URINE CULTURE

## 2016-08-08 ENCOUNTER — Other Ambulatory Visit: Payer: Self-pay | Admitting: Gynecology

## 2016-08-08 MED ORDER — SULFAMETHOXAZOLE-TRIMETHOPRIM 800-160 MG PO TABS: 1.0000 | ORAL_TABLET | Freq: Two times a day (BID) | ORAL | 0 refills | Status: DC

## 2016-10-31 ENCOUNTER — Encounter: Payer: Self-pay | Admitting: Family Medicine

## 2016-10-31 ENCOUNTER — Ambulatory Visit (INDEPENDENT_AMBULATORY_CARE_PROVIDER_SITE_OTHER): Payer: BLUE CROSS/BLUE SHIELD | Admitting: Family Medicine

## 2016-10-31 VITALS — BP 98/70 | HR 89 | Temp 98.7°F | Ht 59.0 in | Wt 208.5 lb

## 2016-10-31 DIAGNOSIS — R1011 Right upper quadrant pain: Secondary | ICD-10-CM

## 2016-10-31 DIAGNOSIS — Z7189 Other specified counseling: Secondary | ICD-10-CM

## 2016-10-31 LAB — COMPREHENSIVE METABOLIC PANEL
ALK PHOS: 48 U/L (ref 39–117)
ALT: 9 U/L (ref 0–35)
AST: 12 U/L (ref 0–37)
Albumin: 4.1 g/dL (ref 3.5–5.2)
BUN: 10 mg/dL (ref 6–23)
CHLORIDE: 105 meq/L (ref 96–112)
CO2: 25 mEq/L (ref 19–32)
Calcium: 9.5 mg/dL (ref 8.4–10.5)
Creatinine, Ser: 0.59 mg/dL (ref 0.40–1.20)
GFR: 134.73 mL/min (ref >60.00)
GLUCOSE: 87 mg/dL (ref 70–99)
POTASSIUM: 3.7 meq/L (ref 3.5–5.1)
SODIUM: 138 meq/L (ref 135–145)
Total Bilirubin: 0.5 mg/dL (ref 0.2–1.2)
Total Protein: 7 g/dL (ref 6.0–8.3)

## 2016-10-31 NOTE — Progress Notes (Signed)
New patient.  To re-est care.   She isn't lightheaded on standing.    College grad, d/w pt.  She is looking for work at this point.    Off and on sx.  She has some sharp episodic pain in the R upper abdomen, along the lower ribs.  Not in her back.  No L sided pain.  No FCNAVD.  Has been going on for about 1 year.  Unclear if being on her feet at work makes a difference.  The area can be locally puffy.  No dysuria.  No stool changes.  No suprapubic pain. No changes with eating.    LMP was 4 weeks ago, due to start this week.  Hasn't missed a period.    D/w pt about UTI dx and infection vs colonization.  D/w pt about tx only if dysuria.  Prev tx for UTI didn't make a difference in the pain described above.   Surgical history noted, discussed with patient.  Advance directive discussed with patient. She would have her father designated if she were incapacitated.  PMH and SH reviewed  ROS: Per HPI unless specifically indicated in ROS section   Meds, vitals, and allergies reviewed.   GEN: nad, alert and oriented HEENT: mucous membranes moist NECK: supple w/o LA CV: rrr. PULM: ctab, no inc wob ABD: soft, +bs, RUQ not ttp, murphy neg EXT: no edema SKIN: no acute rash No cva pain

## 2016-10-31 NOTE — Patient Instructions (Addendum)
Go to the lab on the way out.  We'll contact you with your lab report. Bonita QuinLinda will call about your referral, see her on the way out.  Take care.  Glad to see you.  Update me as needed.

## 2016-11-01 ENCOUNTER — Encounter: Payer: Self-pay | Admitting: Family Medicine

## 2016-11-01 ENCOUNTER — Ambulatory Visit
Admission: RE | Admit: 2016-11-01 | Discharge: 2016-11-01 | Disposition: A | Discharge status: Home / Self Care | Payer: BLUE CROSS/BLUE SHIELD | Source: Ambulatory Visit | Attending: Family Medicine | Admitting: Family Medicine

## 2016-11-01 DIAGNOSIS — R1011 Right upper quadrant pain: Secondary | ICD-10-CM | POA: Insufficient documentation

## 2016-11-01 DIAGNOSIS — Z7189 Other specified counseling: Secondary | ICD-10-CM | POA: Insufficient documentation

## 2016-11-01 DIAGNOSIS — N261 Atrophy of kidney (terminal): Secondary | ICD-10-CM | POA: Diagnosis not present

## 2016-11-01 NOTE — Assessment & Plan Note (Signed)
Advance directive discussed with patient. She would have her father designated if she were incapacitated.

## 2016-11-01 NOTE — Assessment & Plan Note (Signed)
>  30 minutes spent in face to face time with patient, >50% spent in counselling or coordination of care.  Discussed with patient about symptoms, differential diagnosis, and plan. Possible that she could have gallstones or gallbladder pathology causing right upper quadrant pain. She could have symptoms related to previous ureter intervention in childhood. Reasonable to check abdominal ultrasound and basic labs today. Okay for outpatient follow-up.  See above regarding discussion of UTI, which is not likely given that she has no dysuria. She could have had episodic colonization with bacteria in the past.

## 2016-11-02 ENCOUNTER — Other Ambulatory Visit: Payer: Self-pay | Admitting: Family Medicine

## 2016-11-02 DIAGNOSIS — R1011 Right upper quadrant pain: Secondary | ICD-10-CM

## 2016-11-04 ENCOUNTER — Telehealth: Payer: Self-pay | Admitting: Family Medicine

## 2016-11-04 NOTE — Telephone Encounter (Signed)
Patient returned Lugene's call. °

## 2016-11-07 ENCOUNTER — Telehealth: Payer: Self-pay | Admitting: Family Medicine

## 2016-11-07 DIAGNOSIS — R1011 Right upper quadrant pain: Secondary | ICD-10-CM

## 2016-11-07 NOTE — Telephone Encounter (Signed)
Please change order for NM hepatobili  to [id number] [facility] scheduling thanks

## 2016-11-07 NOTE — Telephone Encounter (Signed)
Done.  Thanks.  Old order removed.

## 2016-11-17 ENCOUNTER — Encounter (HOSPITAL_COMMUNITY): Payer: BLUE CROSS/BLUE SHIELD

## 2016-11-21 DIAGNOSIS — I776 Arteritis, unspecified: Secondary | ICD-10-CM | POA: Diagnosis not present

## 2016-11-21 DIAGNOSIS — L958 Other vasculitis limited to the skin: Secondary | ICD-10-CM | POA: Diagnosis not present

## 2016-11-21 DIAGNOSIS — R21 Rash and other nonspecific skin eruption: Secondary | ICD-10-CM | POA: Diagnosis not present

## 2016-11-21 DIAGNOSIS — Z79899 Other long term (current) drug therapy: Secondary | ICD-10-CM | POA: Diagnosis not present

## 2016-11-21 DIAGNOSIS — M310 Hypersensitivity angiitis: Secondary | ICD-10-CM | POA: Diagnosis not present

## 2016-11-21 DIAGNOSIS — D693 Immune thrombocytopenic purpura: Secondary | ICD-10-CM | POA: Diagnosis not present

## 2016-11-28 ENCOUNTER — Ambulatory Visit (HOSPITAL_COMMUNITY)
Admission: RE | Admit: 2016-11-28 | Discharge: 2016-11-28 | Disposition: A | Discharge status: Home / Self Care | Payer: BLUE CROSS/BLUE SHIELD | Source: Ambulatory Visit | Attending: Family Medicine | Admitting: Family Medicine

## 2016-11-28 DIAGNOSIS — R1011 Right upper quadrant pain: Secondary | ICD-10-CM

## 2016-11-28 MED ORDER — TECHNETIUM TC 99M MEBROFENIN IV KIT
5.0000 | PACK | Freq: Once | INTRAVENOUS | Status: AC | PRN
  Administered 2016-11-28: 5 via INTRAVENOUS

## 2016-12-13 DIAGNOSIS — M310 Hypersensitivity angiitis: Secondary | ICD-10-CM | POA: Diagnosis not present

## 2017-07-04 ENCOUNTER — Other Ambulatory Visit: Payer: Self-pay | Admitting: Gynecology

## 2017-08-07 ENCOUNTER — Ambulatory Visit (INDEPENDENT_AMBULATORY_CARE_PROVIDER_SITE_OTHER): Payer: BLUE CROSS/BLUE SHIELD | Admitting: Gynecology

## 2017-08-07 ENCOUNTER — Encounter: Payer: Self-pay | Admitting: Gynecology

## 2017-08-07 VITALS — BP 116/74 | Ht 60.0 in | Wt 202.0 lb

## 2017-08-07 DIAGNOSIS — Z01419 Encounter for gynecological examination (general) (routine) without abnormal findings: Secondary | ICD-10-CM | POA: Diagnosis not present

## 2017-08-07 DIAGNOSIS — Z113 Encounter for screening for infections with a predominantly sexual mode of transmission: Secondary | ICD-10-CM

## 2017-08-07 LAB — COMPREHENSIVE METABOLIC PANEL
AG RATIO: 1.8 (calc) (ref 1.0–2.5)
ALBUMIN MSPROF: 4.4 g/dL (ref 3.6–5.1)
ALKALINE PHOSPHATASE (APISO): 53 U/L (ref 33–115)
ALT: 9 U/L (ref 6–29)
AST: 12 U/L (ref 10–30)
BUN: 9 mg/dL (ref 7–25)
CHLORIDE: 103 mmol/L (ref 98–110)
CO2: 23 mmol/L (ref 20–32)
Calcium: 9.4 mg/dL (ref 8.6–10.2)
Creat: 0.56 mg/dL (ref 0.50–1.10)
GLOBULIN: 2.4 g/dL (ref 1.9–3.7)
Glucose, Bld: 84 mg/dL (ref 65–99)
POTASSIUM: 4 mmol/L (ref 3.5–5.3)
Sodium: 137 mmol/L (ref 135–146)
Total Bilirubin: 0.5 mg/dL (ref 0.2–1.2)
Total Protein: 6.8 g/dL (ref 6.1–8.1)

## 2017-08-07 LAB — CBC WITH DIFFERENTIAL/PLATELET
BASOS PCT: 0.4 %
Basophils Absolute: 51 cells/uL (ref 0–200)
EOS ABS: 115 {cells}/uL (ref 15–500)
Eosinophils Relative: 0.9 %
HCT: 43 % (ref 35.0–45.0)
HEMOGLOBIN: 14.5 g/dL (ref 11.7–15.5)
LYMPHS ABS: 3187 {cells}/uL (ref 850–3900)
MCH: 25.9 pg — AB (ref 27.0–33.0)
MCHC: 33.7 g/dL (ref 32.0–36.0)
MCV: 76.9 fL — AB (ref 80.0–100.0)
MPV: 10.1 fL (ref 7.5–12.5)
Monocytes Relative: 4.8 %
NEUTROS ABS: 8832 {cells}/uL — AB (ref 1500–7800)
Neutrophils Relative %: 69 %
PLATELETS: 403 10*3/uL — AB (ref 140–400)
RBC: 5.59 10*6/uL — AB (ref 3.80–5.10)
RDW: 13.7 % (ref 11.0–15.0)
TOTAL LYMPHOCYTE: 24.9 %
WBC: 12.8 10*3/uL — AB (ref 3.8–10.8)
WBCMIX: 614 {cells}/uL (ref 200–950)

## 2017-08-07 MED ORDER — NORETHINDRONE-ETH ESTRADIOL 1-35 MG-MCG PO TABS: 1.0000 | ORAL_TABLET | Freq: Every day | ORAL | 12 refills | Status: DC

## 2017-08-07 NOTE — Progress Notes (Signed)
    Andres EgeSarah C Frandock 05-29-1995 [id number]        22 y.o.  G0P0 for annual gynecologic exam.  Doing well without complaints.  Past medical history,surgical history, problem list, medications, allergies, family history and social history were all reviewed and documented as reviewed in the EPIC chart.  ROS:  Performed with pertinent positives and negatives included in the history, assessment and plan.   Additional significant findings : None   Exam: Kennon PortelaKim Gardner assistant Vitals:   08/07/17 1504  BP: 116/74  Weight: 202 lb (91.6 kg)  Height: 5' (1.524 m)   Body mass index is 39.45 kg/m.  General appearance:  Normal affect, orientation and appearance. Skin: Grossly normal HEENT: Without gross lesions.  No cervical or supraclavicular adenopathy. Thyroid normal.  Lungs:  Clear without wheezing, rales or rhonchi Cardiac: RR, without RMG Abdominal:  Soft, nontender, without masses, guarding, rebound, organomegaly or hernia Breasts:  Examined lying and sitting without masses, retractions, discharge or axillary adenopathy. Pelvic:  Ext, BUS, Vagina: Normal  Cervix: Normal.  GC/Chlamydia screen  Uterus: Anteverted, normal size, shape and contour, midline and mobile nontender   Adnexa: Without masses or tenderness    Anus and perineum: Normal    Assessment/Plan:  22 y.o. G0P0 female for annual gynecologic exam with regular menses, oral contraceptives.   1. Continues on Ortho-Novum 1/35 equivalents.  Doing well.  Refill times 1 year provided. 2. Breast health.  SBE reviewed.  Breast exam normal today. 3. Pap smear 2017.  No Pap smear done today.  No history of abnormal Pap smears.  Plan repeat Pap smear next year at 3-year interval per current screening guidelines. 4. Gardasil series reportedly received. 5. STD screening.  GC/Chlamydia done.  6. Health maintenance.  Baseline CBC and comprehensive metabolic panel ordered.  Follow-up 1 year, sooner as needed.   Dara Lordsimothy P Devann Cribb MD,  3:31 PM 08/07/2017

## 2017-08-07 NOTE — Patient Instructions (Signed)
Follow-up in 1 year for annual exam, sooner if any issues. 

## 2017-08-08 LAB — C. TRACHOMATIS/N. GONORRHOEAE RNA
C. TRACHOMATIS RNA, TMA: NOT DETECTED
N. GONORRHOEAE RNA, TMA: NOT DETECTED

## 2017-08-09 ENCOUNTER — Other Ambulatory Visit: Payer: Self-pay | Admitting: Gynecology

## 2017-08-09 DIAGNOSIS — D72828 Other elevated white blood cell count: Secondary | ICD-10-CM

## 2017-08-09 DIAGNOSIS — R7989 Other specified abnormal findings of blood chemistry: Secondary | ICD-10-CM

## 2017-09-11 ENCOUNTER — Other Ambulatory Visit: Payer: BLUE CROSS/BLUE SHIELD

## 2017-09-11 DIAGNOSIS — R7989 Other specified abnormal findings of blood chemistry: Secondary | ICD-10-CM

## 2017-09-11 DIAGNOSIS — D72828 Other elevated white blood cell count: Secondary | ICD-10-CM | POA: Diagnosis not present

## 2017-09-11 LAB — CBC WITH DIFFERENTIAL/PLATELET
BASOS ABS: 26 {cells}/uL (ref 0–200)
Basophils Relative: 0.3 %
EOS PCT: 1.8 %
Eosinophils Absolute: 158 cells/uL (ref 15–500)
HEMATOCRIT: 43.2 % (ref 35.0–45.0)
HEMOGLOBIN: 14.3 g/dL (ref 11.7–15.5)
LYMPHS ABS: 2077 {cells}/uL (ref 850–3900)
MCH: 25.9 pg — ABNORMAL LOW (ref 27.0–33.0)
MCHC: 33.1 g/dL (ref 32.0–36.0)
MCV: 78.3 fL — ABNORMAL LOW (ref 80.0–100.0)
MPV: 10.3 fL (ref 7.5–12.5)
Monocytes Relative: 4.6 %
NEUTROS ABS: 6134 {cells}/uL (ref 1500–7800)
Neutrophils Relative %: 69.7 %
Platelets: 415 10*3/uL — ABNORMAL HIGH (ref 140–400)
RBC: 5.52 10*6/uL — ABNORMAL HIGH (ref 3.80–5.10)
RDW: 13.4 % (ref 11.0–15.0)
Total Lymphocyte: 23.6 %
WBC: 8.8 10*3/uL (ref 3.8–10.8)
WBCMIX: 405 {cells}/uL (ref 200–950)

## 2017-09-15 ENCOUNTER — Other Ambulatory Visit: Payer: Self-pay

## 2017-10-25 ENCOUNTER — Encounter: Payer: Self-pay | Admitting: Family Medicine

## 2017-10-25 ENCOUNTER — Ambulatory Visit (INDEPENDENT_AMBULATORY_CARE_PROVIDER_SITE_OTHER): Payer: BLUE CROSS/BLUE SHIELD | Admitting: Family Medicine

## 2017-10-25 VITALS — BP 108/68 | HR 92 | Temp 98.7°F | Ht 59.0 in | Wt 203.0 lb

## 2017-10-25 DIAGNOSIS — D690 Allergic purpura: Secondary | ICD-10-CM

## 2017-10-25 DIAGNOSIS — J029 Acute pharyngitis, unspecified: Secondary | ICD-10-CM | POA: Diagnosis not present

## 2017-10-25 LAB — POCT RAPID STREP A (OFFICE): Rapid Strep A Screen: NEGATIVE

## 2017-10-25 MED ORDER — AMOXICILLIN 875 MG PO TABS: 875.0000 mg | ORAL_TABLET | Freq: Two times a day (BID) | ORAL | 0 refills | Status: DC

## 2017-10-25 NOTE — Progress Notes (Signed)
Started with some aches about 2 days ago.  Went to work yesterday with chills and aches, didn't feel well in general.  Temp 101.2 yesterday.  This AM with 100.7 temp.  Out of work today.  ST today.  Took aleve today.  Less aches today.  Meds, vitals, and allergies reviewed.   ROS: Per HPI unless specifically indicated in ROS section   GEN: nad, alert and oriented HEENT: mucous membranes moist, tm w/o erythema, nasal exam w/o erythema, clear discharge noted,  OP with cobblestoning and L sided exudates NECK: supple w/o LA CV: rrr.   PULM: ctab, no inc wob EXT: no edema SKIN: no acute rash  RST neg, d/w pt at OV.

## 2017-10-25 NOTE — Patient Instructions (Signed)
Presumed strep.  Start amoxil.   If you have a fever then stay out of work.  Drink plenty of fluids, take tylenol as needed, and gargle with warm salt water for your throat.  This should gradually improve.  Take care.  Let us know if you have other concerns.

## 2017-10-26 ENCOUNTER — Other Ambulatory Visit: Payer: Self-pay | Admitting: Family Medicine

## 2017-10-26 ENCOUNTER — Telehealth: Payer: Self-pay | Admitting: Family Medicine

## 2017-10-26 DIAGNOSIS — J029 Acute pharyngitis, unspecified: Secondary | ICD-10-CM | POA: Insufficient documentation

## 2017-10-26 MED ORDER — AMOXICILLIN 875 MG PO TABS: 875.0000 mg | ORAL_TABLET | Freq: Two times a day (BID) | ORAL | 0 refills | Status: AC

## 2017-10-26 MED ORDER — AMOXICILLIN 875 MG PO TABS: 875.0000 mg | ORAL_TABLET | Freq: Two times a day (BID) | ORAL | 0 refills | Status: DC

## 2017-10-26 NOTE — Telephone Encounter (Signed)
Taken care of in another encounter 

## 2017-10-26 NOTE — Telephone Encounter (Signed)
Copied from CRM [id number]. Topic: Quick Communication - Rx Refill/Question >> Oct 26, 2017  9:22 AM Louie BunPalacios Medina, Rosey Batheresa D wrote: Medication: amoxicillin (AMOXIL) 875 MG tablet  Has the patient contacted their pharmacy? Yes, but pharmacy did not received it, if it can be send to her normal pharmacy is [place] please. (Agent: If no, request that the patient contact the pharmacy for the refill.) (Agent: If yes, when and what did the pharmacy advise?)  Preferred Pharmacy (with phone number or street name): [facility] - [place] - [address]  Agent: Please be advised that RX refills may take up to 3 business days. We ask that you follow-up with your pharmacy.

## 2017-10-26 NOTE — Assessment & Plan Note (Signed)
Presumed strep throat.  4 out of 5 criteria met.  Possible false negative strep test. Fever, ST, absence of cough, L sided exudates.  Start amoxicillin.  Routine cautions given.  Update me as needed. She agrees.

## 2017-10-26 NOTE — Progress Notes (Signed)
Patient needed rx resent.  Done.  Printed and given to patient.

## 2018-08-07 ENCOUNTER — Other Ambulatory Visit: Payer: Self-pay | Admitting: Gynecology

## 2018-08-07 NOTE — Telephone Encounter (Signed)
Annual scheduled on 08/24/18

## 2018-08-09 ENCOUNTER — Encounter: Payer: BLUE CROSS/BLUE SHIELD | Admitting: Gynecology

## 2018-08-14 ENCOUNTER — Other Ambulatory Visit: Payer: Self-pay

## 2018-08-15 ENCOUNTER — Other Ambulatory Visit: Payer: Self-pay | Admitting: *Deleted

## 2018-08-15 ENCOUNTER — Encounter: Payer: Self-pay | Admitting: Gynecology

## 2018-08-15 ENCOUNTER — Encounter: Payer: BLUE CROSS/BLUE SHIELD | Admitting: Gynecology

## 2018-08-15 ENCOUNTER — Ambulatory Visit (INDEPENDENT_AMBULATORY_CARE_PROVIDER_SITE_OTHER): Payer: BLUE CROSS/BLUE SHIELD | Admitting: Gynecology

## 2018-08-15 VITALS — BP 120/74 | Ht 60.0 in | Wt 198.0 lb

## 2018-08-15 DIAGNOSIS — Z113 Encounter for screening for infections with a predominantly sexual mode of transmission: Secondary | ICD-10-CM | POA: Diagnosis not present

## 2018-08-15 DIAGNOSIS — R8290 Unspecified abnormal findings in urine: Secondary | ICD-10-CM

## 2018-08-15 DIAGNOSIS — Z01419 Encounter for gynecological examination (general) (routine) without abnormal findings: Secondary | ICD-10-CM | POA: Diagnosis not present

## 2018-08-15 MED ORDER — SULFAMETHOXAZOLE-TRIMETHOPRIM 800-160 MG PO TABS: 1.0000 | ORAL_TABLET | Freq: Two times a day (BID) | ORAL | 0 refills | Status: DC

## 2018-08-15 MED ORDER — NORETHINDRONE-ETH ESTRADIOL 1-35 MG-MCG PO TABS: 1.0000 | ORAL_TABLET | Freq: Every day | ORAL | 4 refills | Status: DC

## 2018-08-15 NOTE — Patient Instructions (Signed)
Follow-up in 1 year for annual exam, sooner if any issues. 

## 2018-08-15 NOTE — Progress Notes (Signed)
    Brooke Haley [birth year] [id number]        23 y.o.  G0P0 for annual gynecologic exam.  Doing well without gynecologic complaints  Past medical history,surgical history, problem list, medications, allergies, family history and social history were all reviewed and documented as reviewed in the EPIC chart.  ROS:  Performed with pertinent positives and negatives included in the history, assessment and plan.   Additional significant findings : None   Exam: Kennon Portela assistant Vitals:   08/15/18 0927  BP: 120/74  Weight: 198 lb (89.8 kg)  Height: 5' (1.524 m)   Body mass index is 38.67 kg/m.  General appearance:  Normal affect, orientation and appearance. Skin: Grossly normal HEENT: Without gross lesions.  No cervical or supraclavicular adenopathy. Thyroid normal.  Lungs:  Clear without wheezing, rales or rhonchi Cardiac: RR, without RMG Abdominal:  Soft, nontender, without masses, guarding, rebound, organomegaly or hernia Breasts:  Examined lying and sitting without masses, retractions, discharge or axillary adenopathy. Pelvic:  Ext, BUS, Vagina: Normal  Cervix: Normal.  Pap smear, GC/chlamydia  Uterus: Anteverted, normal size, shape and contour, midline and mobile nontender   Adnexa: Without masses or tenderness    Anus and perineum: Normal    Assessment/Plan:  23 y.o. G0P0 female for annual gynecologic exam.  With regular menses, oral contraceptives  1. Oral contraceptives.  Doing well on 1/35 equivalent.  Refill x1 year provided. 2. Urine odor.  Patient noted several weeks ago increased odor in her urine and some mild dysuria.  Used OTC Azo and that her symptoms resolved although now over the last day or so she notes a slight odor to her urine.  No dysuria frequency urgency.  Will check baseline urine analysis.  Push fluids for now. 3. STD screening discussed.  GC/chlamydia screen done.  Continue condoms regardless of being on BCPs to help decrease STD risks 4.  Breast health.  Breast exam normal today.  SBE monthly reviewed. 5. Gardasil series reportedly received. 6. Pap smear 2017.  Pap smear done today.  No history of abnormal Pap smears. 7. Health maintenance.  CBC, random glucose ordered.  Follow-up 1 year, sooner as needed.   Dara Lords MD, 9:46 AM 08/15/2018

## 2018-08-15 NOTE — Addendum Note (Signed)
Addended by: Dayna Barker on: 08/15/2018 10:10 AM   Modules accepted: Orders

## 2018-08-16 LAB — CBC WITH DIFFERENTIAL/PLATELET
Absolute Monocytes: 442 cells/uL (ref 200–950)
Basophils Absolute: 28 cells/uL (ref 0–200)
Basophils Relative: 0.3 %
Eosinophils Absolute: 101 cells/uL (ref 15–500)
Eosinophils Relative: 1.1 %
HCT: 45.4 % — ABNORMAL HIGH (ref 35.0–45.0)
Hemoglobin: 14.9 g/dL (ref 11.7–15.5)
Lymphs Abs: 1849 cells/uL (ref 850–3900)
MCH: 25.7 pg — ABNORMAL LOW (ref 27.0–33.0)
MCHC: 32.8 g/dL (ref 32.0–36.0)
MCV: 78.4 fL — ABNORMAL LOW (ref 80.0–100.0)
MPV: 10.6 fL (ref 7.5–12.5)
Monocytes Relative: 4.8 %
Neutro Abs: 6780 cells/uL (ref 1500–7800)
Neutrophils Relative %: 73.7 %
Platelets: 453 10*3/uL — ABNORMAL HIGH (ref 140–400)
RBC: 5.79 10*6/uL — ABNORMAL HIGH (ref 3.80–5.10)
RDW: 14.6 % (ref 11.0–15.0)
Total Lymphocyte: 20.1 %
WBC: 9.2 10*3/uL (ref 3.8–10.8)

## 2018-08-16 LAB — PAP IG W/ RFLX HPV ASCU

## 2018-08-16 LAB — C. TRACHOMATIS/N. GONORRHOEAE RNA
C. trachomatis RNA, TMA: NOT DETECTED
N. gonorrhoeae RNA, TMA: NOT DETECTED

## 2018-08-16 LAB — GLUCOSE, RANDOM: Glucose, Bld: 89 mg/dL (ref 65–99)

## 2018-08-17 LAB — URINALYSIS, COMPLETE W/RFL CULTURE
Bilirubin Urine: NEGATIVE
Glucose, UA: NEGATIVE
Hgb urine dipstick: NEGATIVE
Hyaline Cast: NONE SEEN /LPF
Nitrites, Initial: NEGATIVE
Protein, ur: NEGATIVE
RBC / HPF: NONE SEEN /HPF (ref 0–2)
Specific Gravity, Urine: 1.016 (ref 1.001–1.03)
Squamous Epithelial / LPF: NONE SEEN /HPF (ref <=5)
pH: 5.5 (ref 5.0–8.0)

## 2018-08-17 LAB — URINE CULTURE
MICRO NUMBER:: 385821
SPECIMEN QUALITY:: ADEQUATE

## 2018-08-17 LAB — CULTURE INDICATED

## 2018-09-03 ENCOUNTER — Encounter: Payer: BLUE CROSS/BLUE SHIELD | Admitting: Gynecology

## 2018-09-08 IMAGING — US US ABDOMEN COMPLETE
1 series · 13 of 25 positions shown · non-contrast
Comparison: None.

CLINICAL DATA: 20-year-old female with intermittent right upper
quadrant abdominal pain for 6 months. Patient reports right side
ureteral/bladder surgery as an infant.

EXAM:
ABDOMEN ULTRASOUND COMPLETE

[Series 1: us abdomen complete · 0.22mm/px · 13 of 194 slices shown]
[im 1/194]
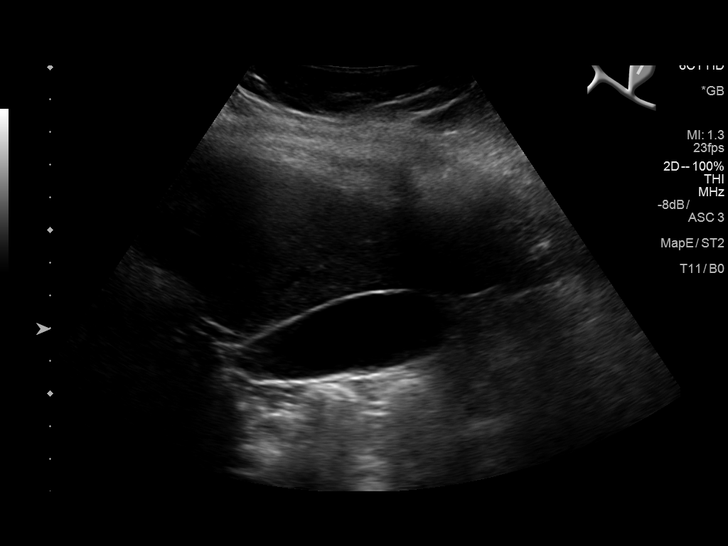
[im 17/194]
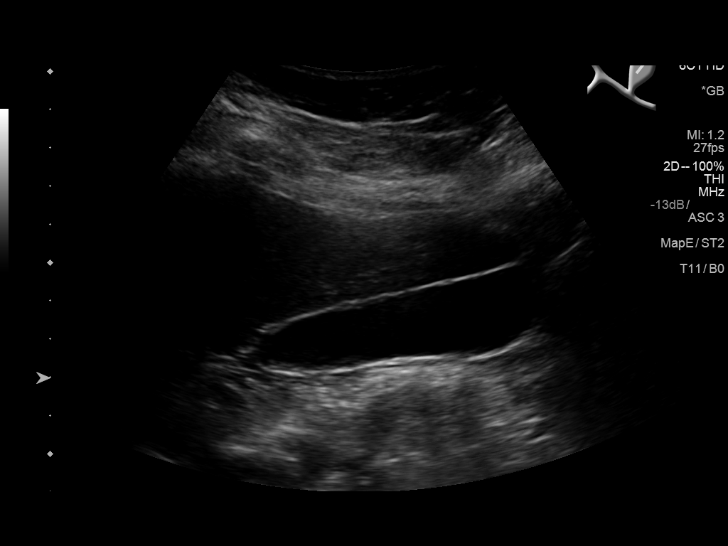
[im 33/194]
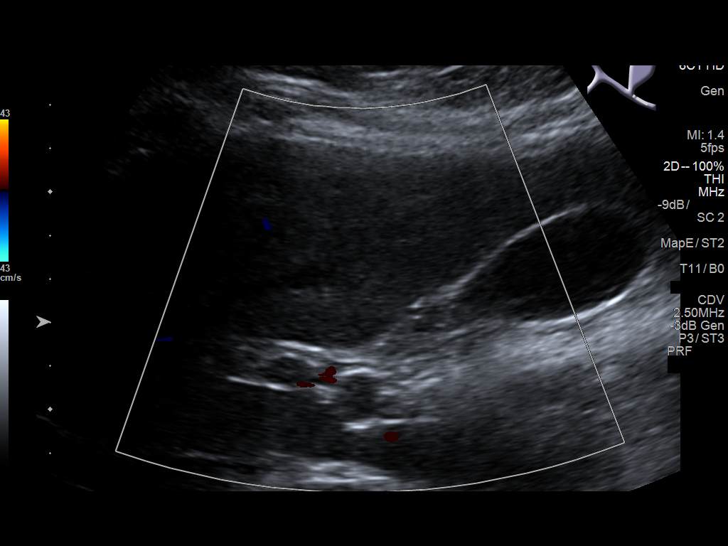
[im 49/194]
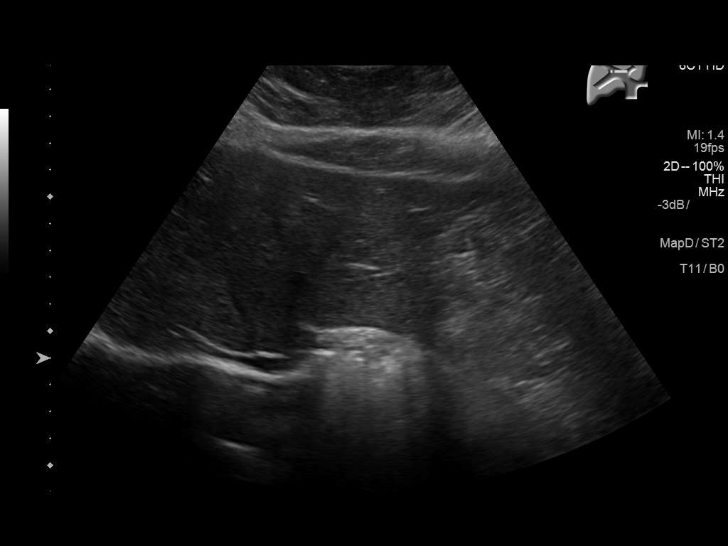
[im 65/194]
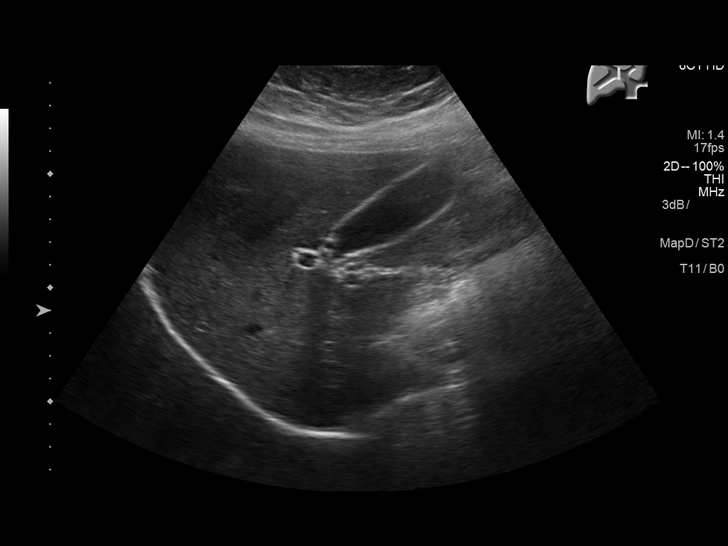
[im 81/194]
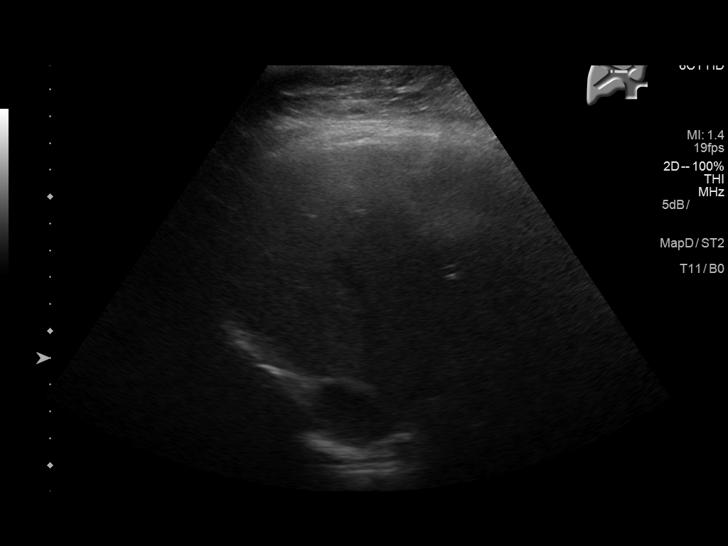
[im 97/194]
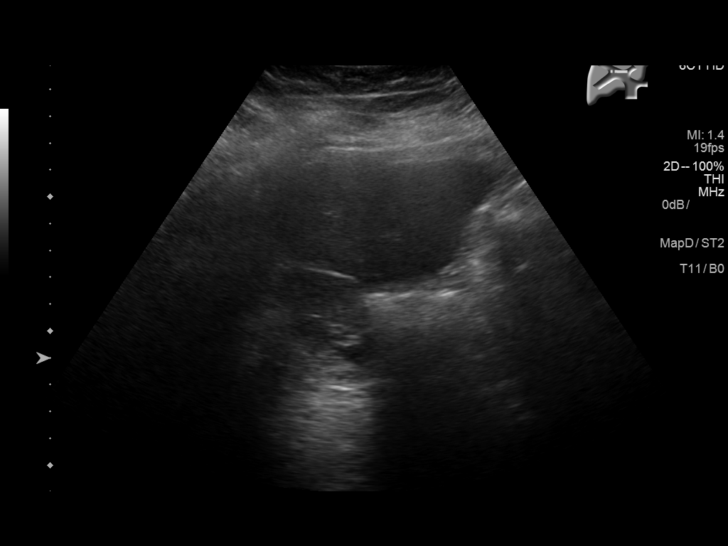
[im 113/194]
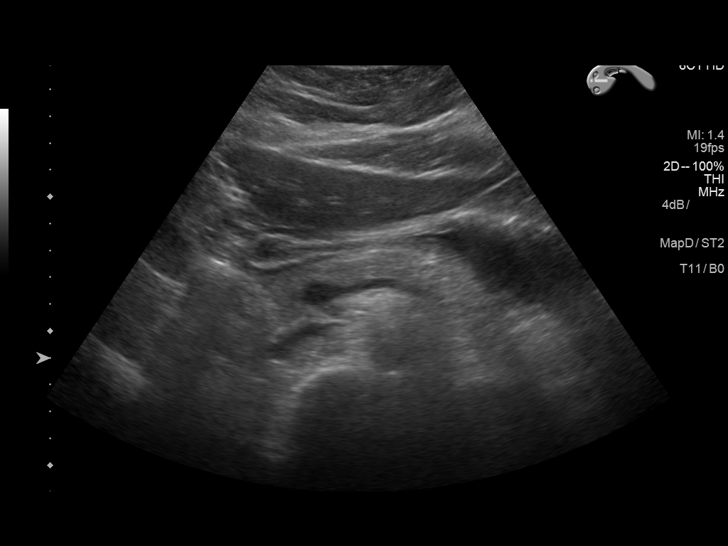
[im 129/194]
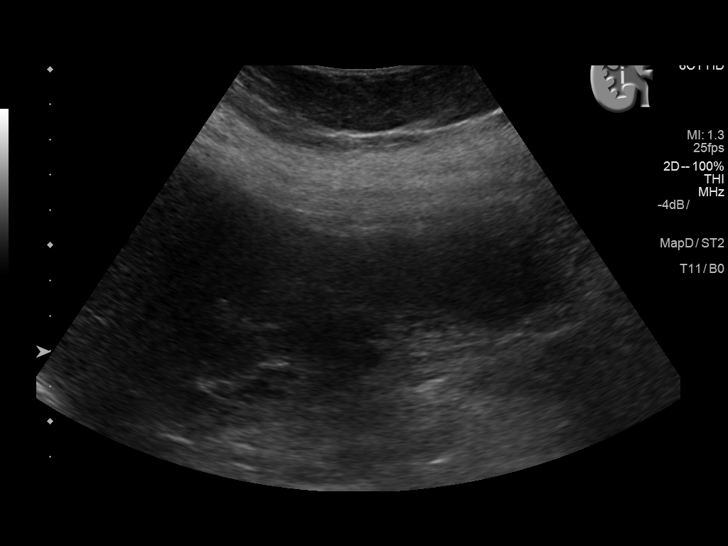
[im 145/194]
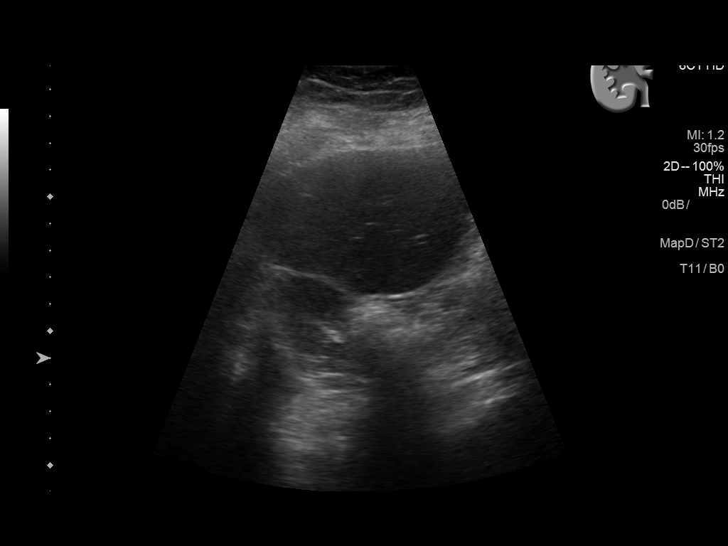
[im 161/194]
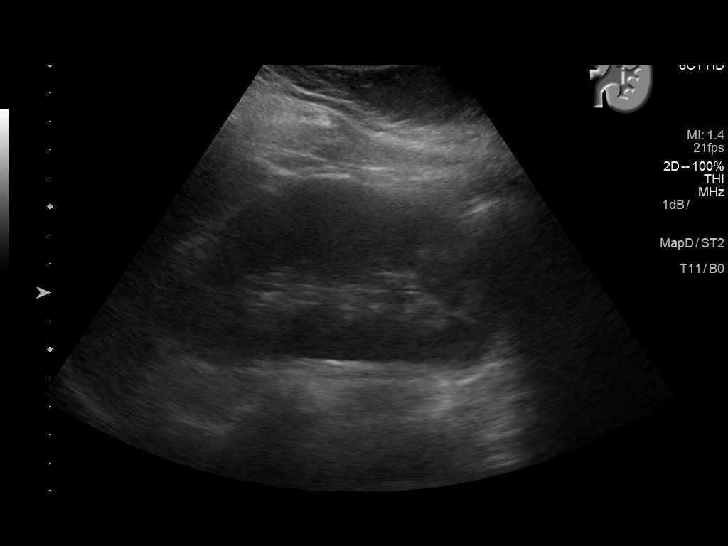
[im 177/194]
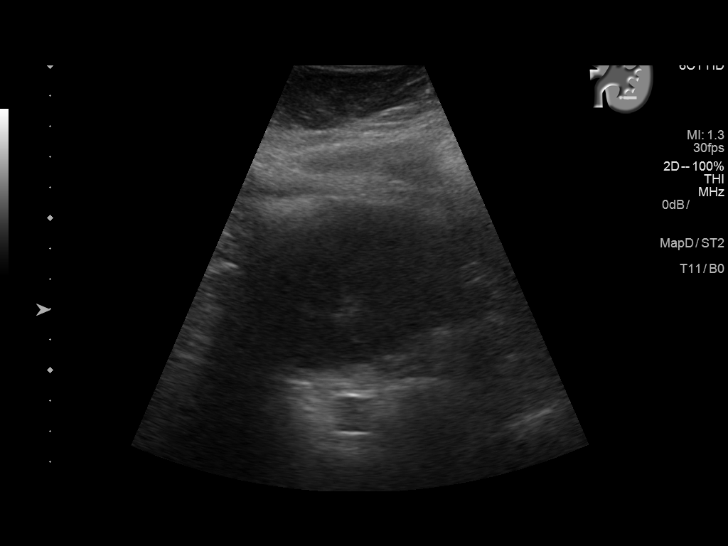
[im 194/194]
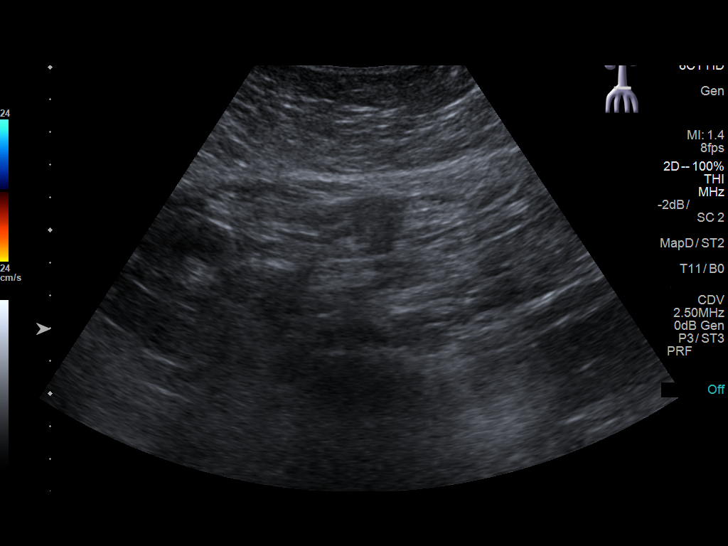

[13 of 25 positions shown; findings below may reference images not displayed]

FINDINGS: Gallbladder: No gallstones or wall thickening visualized. No
sonographic Murphy sign noted by sonographer.

Common bile duct: Diameter: 2 mm, normal

Liver: No focal lesion identified. Within normal limits in
parenchymal echogenicity.

IVC: No abnormality visualized.

Pancreas: Visualized portion unremarkable.

Spleen: Size and appearance within normal limits.

Right Kidney: Length: 7.0 cm, asymmetrically small and appears
partially atrophied. No hydronephrosis or discrete right renal
lesion. Renal cortical echotexture is maintained.

Left Kidney: Length: 12.7 cm, perhaps with mild compensatory
hypertrophy. Echogenicity within normal limits. No mass or
hydronephrosis visualized.

Abdominal aorta: Appears normal.

Other findings: None.
IMPRESSION: 1. Partial right renal atrophy, and perhaps mild compensatory
hypertrophy of the left kidney.
2. Otherwise normal abdomen ultrasound, including normal ultra
appearance of the gallbladder, liver, and common bile duct.

## 2018-12-05 DIAGNOSIS — Z20828 Contact with and (suspected) exposure to other viral communicable diseases: Secondary | ICD-10-CM | POA: Diagnosis not present

## 2019-01-28 ENCOUNTER — Encounter: Payer: Self-pay | Admitting: Gynecology

## 2019-06-28 IMAGING — NM NM HEPATO W/GB/PHARM/[PERSON_NAME]
2 series · 12 of 12 positions shown · non-contrast
Comparison: None.

CLINICAL DATA: Right upper quadrant pain for 1 year

EXAM:
NUCLEAR MEDICINE HEPATOBILIARY IMAGING WITH GALLBLADDER EF
TECHNIQUE: Sequential images of the abdomen were obtained [DATE] minutes
following intravenous administration of radiopharmaceutical. After
oral ingestion of Ensure, gallbladder ejection fraction was
determined. At 60 min, normal ejection fraction is greater than 33%.
RADIOPHARMACEUTICALS:  5.2 mCi Ac-TTm  Choletec IV

[he hepatobiliary · 3.43mm/px · 6 of 60 frames shown (1 of 2)]
[frame 6/60]
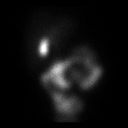
[frame 16/60]
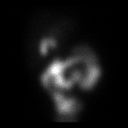
[frame 26/60]
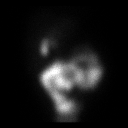
[frame 36/60]
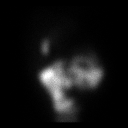
[frame 46/60]
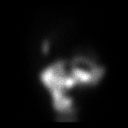
[frame 56/60]
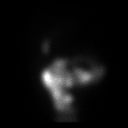

[he hepatobiliary · 3.43mm/px · 6 of 41 frames shown (2 of 2)]
[frame 4/41]
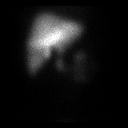
[frame 10/41]
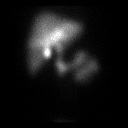
[frame 17/41]
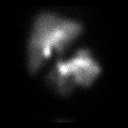
[frame 24/41]
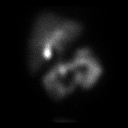
[frame 31/41]
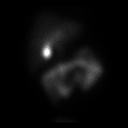
[frame 38/41]
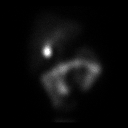

[12 of 12 positions shown; findings below may reference images not displayed]

FINDINGS: Prompt uptake and biliary excretion of activity by the liver is
seen. Gallbladder activity is visualized, consistent with patency of
cystic duct. Biliary activity passes into small bowel, consistent
with patent common bile duct.

Calculated gallbladder ejection fraction is 83%. (Normal gallbladder
ejection fraction with Ensure is greater than 33%.)
IMPRESSION: Normal study

## 2019-08-16 ENCOUNTER — Other Ambulatory Visit: Payer: Self-pay

## 2019-08-16 ENCOUNTER — Encounter: Payer: BLUE CROSS/BLUE SHIELD | Admitting: Obstetrics and Gynecology

## 2019-08-19 ENCOUNTER — Ambulatory Visit (INDEPENDENT_AMBULATORY_CARE_PROVIDER_SITE_OTHER): Payer: BC Managed Care – PPO | Admitting: Obstetrics and Gynecology

## 2019-08-19 ENCOUNTER — Encounter: Payer: Self-pay | Admitting: Obstetrics and Gynecology

## 2019-08-19 ENCOUNTER — Other Ambulatory Visit: Payer: Self-pay

## 2019-08-19 VITALS — BP 122/76 | Ht 59.0 in | Wt 203.0 lb

## 2019-08-19 DIAGNOSIS — N926 Irregular menstruation, unspecified: Secondary | ICD-10-CM

## 2019-08-19 DIAGNOSIS — Z01419 Encounter for gynecological examination (general) (routine) without abnormal findings: Secondary | ICD-10-CM

## 2019-08-19 NOTE — Progress Notes (Signed)
   Brooke Haley [birth year] [id number]  SUBJECTIVE:  24 y.o. G0P0 female for annual routine gynecologic exam. She does have concerns about infrequent menses and ability to conceive. She got married last October, she has been trying to get pregnant with her husband since about 1 year ago.  Her.  Since being off birth control last April has become quite infrequent, she says she had withdrawal bleeding after stopping the birth control last spring, did not have another period until July, had no longer than normal period in November then not again until last month in March.  Current Outpatient Medications  Medication Sig Dispense Refill  . Multiple Vitamin (MULTIVITAMIN) tablet Take 1 tablet by mouth daily.    . Probiotic Product (PROBIOTIC PO) Take by mouth.     No current facility-administered medications for this visit.   Allergies: Patient has no known allergies.  Patient's last menstrual period was 07/16/2019.  Past medical history,surgical history, problem list, medications, allergies, family history and social history were all reviewed and documented as reviewed in the EPIC chart.  ROS:  Feeling well. No dyspnea or chest pain on exertion.  No abdominal pain, change in bowel habits, black or bloody stools.  No urinary tract symptoms. GYN ROS: infrequent menses, no abnormal bleeding, pelvic pain or discharge, no breast pain or new or enlarging lumps on self exam. No neurological complaints.   OBJECTIVE:  Ht 4\' 11"  (1.499 m)   Wt 203 lb (92.1 kg)   LMP 07/16/2019   BMI 41.00 kg/m  The patient appears well, alert, oriented x 3, in no distress. ENT normal.  Neck supple. No cervical or supraclavicular adenopathy or thyromegaly.  Lungs are clear, good air entry, no wheezes, rhonchi or rales. S1 and S2 normal, no murmurs, regular rate and rhythm.  Abdomen soft without tenderness, guarding, mass or organomegaly.  Neurological is normal, no focal findings.  BREAST EXAM: breasts appear  normal, no suspicious masses, no skin or nipple changes or axillary nodes  PELVIC EXAM: VULVA: normal appearing vulva with no masses, tenderness or lesions, VAGINA: normal appearing vagina with normal color and discharge, no lesions, CERVIX: normal appearing cervix without discharge or lesions, UTERUS: uterus is normal size, shape, consistency and nontender, ADNEXA: normal adnexa in size, nontender and no masses  Chaperone: 09/15/2019 present during the examination  ASSESSMENT:  24 y.o. G0P0 here for annual gynecologic exam with concerns of menstrual irregularity  PLAN:   1. Menstrual irregularity and infertility.  Will check baseline labs for work-up including TSH, prolactin, FSH, LH, estradiol, testosterone level.  I would like to have her back to further discuss her lab results and infertility at a future appointment in 2 to 4 weeks. 2. Pap smear 08/2018 normal.  Current screening guidelines calling for the 3-year Pap smear cytology only interval. 3. GC/Chlamydia screen negative last year. 4. Contraception.  Currently trying to conceive so birth control pills have been discontinued. 5. Gardasil series reportedly received. 6. Normal breast exam.  Encouraged breast self awareness and notify 09/2018 of any concerning changes. 7. Health maintenance.  Routine screening blood work including random glucose was checked last year.  We will check CBC today given abnormalities noted on last year's panel.  Return 2-4 weeks or sooner, prn.  [place] MD, FACOG  08/19/19

## 2019-08-20 ENCOUNTER — Other Ambulatory Visit: Payer: Self-pay

## 2019-08-20 DIAGNOSIS — N979 Female infertility, unspecified: Secondary | ICD-10-CM

## 2019-08-20 DIAGNOSIS — N926 Irregular menstruation, unspecified: Secondary | ICD-10-CM

## 2019-08-20 NOTE — Progress Notes (Signed)
Thank you :)

## 2019-08-20 NOTE — Progress Notes (Signed)
Thank you. I do recommended the [facility] when the patient comes back for another appointment on 4/27. We can just have her go to lab then. Can we have her scheduled for a TV ultrasound that day as well for the fertility workup?

## 2019-08-25 LAB — TSH: TSH: 3.3 mIU/L

## 2019-08-25 LAB — TESTOS,TOTAL,FREE AND SHBG (FEMALE)
Free Testosterone: 6.2 pg/mL (ref 0.1–6.4)
Sex Hormone Binding: 40 nmol/L (ref 17–124)
Testosterone, Total, LC-MS-MS: 51 ng/dL — ABNORMAL HIGH (ref 2–45)

## 2019-08-25 LAB — PROLACTIN: Prolactin: 16.9 ng/mL

## 2019-08-25 LAB — CBC
HCT: 43.4 % (ref 35.0–45.0)
Hemoglobin: 13.8 g/dL (ref 11.7–15.5)
MCH: 25 pg — ABNORMAL LOW (ref 27.0–33.0)
MCHC: 31.8 g/dL — ABNORMAL LOW (ref 32.0–36.0)
MCV: 78.8 fL — ABNORMAL LOW (ref 80.0–100.0)
MPV: 10.2 fL (ref 7.5–12.5)
Platelets: 371 10*3/uL (ref 140–400)
RBC: 5.51 10*6/uL — ABNORMAL HIGH (ref 3.80–5.10)
RDW: 14.5 % (ref 11.0–15.0)
WBC: 10 10*3/uL (ref 3.8–10.8)

## 2019-08-25 LAB — FOLLICLE STIMULATING HORMONE: FSH: 9.1 m[IU]/mL

## 2019-08-25 LAB — ESTRADIOL: Estradiol: 68 pg/mL

## 2019-08-25 LAB — LUTEINIZING HORMONE: LH: 14.1 m[IU]/mL

## 2019-09-03 ENCOUNTER — Ambulatory Visit: Payer: BC Managed Care – PPO | Admitting: Obstetrics and Gynecology

## 2019-09-04 ENCOUNTER — Other Ambulatory Visit: Payer: Self-pay

## 2019-09-04 ENCOUNTER — Ambulatory Visit (INDEPENDENT_AMBULATORY_CARE_PROVIDER_SITE_OTHER): Payer: BC Managed Care – PPO | Admitting: Obstetrics and Gynecology

## 2019-09-04 ENCOUNTER — Ambulatory Visit (INDEPENDENT_AMBULATORY_CARE_PROVIDER_SITE_OTHER): Payer: BC Managed Care – PPO

## 2019-09-04 ENCOUNTER — Encounter: Payer: Self-pay | Admitting: Obstetrics and Gynecology

## 2019-09-04 VITALS — BP 122/76

## 2019-09-04 DIAGNOSIS — E282 Polycystic ovarian syndrome: Secondary | ICD-10-CM | POA: Diagnosis not present

## 2019-09-04 DIAGNOSIS — N926 Irregular menstruation, unspecified: Secondary | ICD-10-CM | POA: Diagnosis not present

## 2019-09-04 DIAGNOSIS — E349 Endocrine disorder, unspecified: Secondary | ICD-10-CM

## 2019-09-04 DIAGNOSIS — R7989 Other specified abnormal findings of blood chemistry: Secondary | ICD-10-CM

## 2019-09-04 DIAGNOSIS — N979 Female infertility, unspecified: Secondary | ICD-10-CM | POA: Insufficient documentation

## 2019-09-04 NOTE — Progress Notes (Signed)
JENISIS HARMSEN [birth year] [id number]  SUBJECTIVE:  24 y.o. G0P0 female presents for a fertility work-up continuation from her encounter on 08/19/2019.  She is here today for a pelvic ultrasound which indicated normal uterus but polycystic appearing ovaries.  She has been having infrequent menses.    Current Outpatient Medications  Medication Sig Dispense Refill  . Multiple Vitamin (MULTIVITAMIN) tablet Take 1 tablet by mouth daily.    . Probiotic Product (PROBIOTIC PO) Take by mouth.     No current facility-administered medications for this visit.   Allergies: Patient has no known allergies.  Patient's last menstrual period was 07/16/2019.  Past medical history,surgical history, problem list, medications, allergies, family history and social history were all reviewed and documented as reviewed in the EPIC chart.  ROS:  Performed with pertinent positives and negatives included in the history, assessment and plan.     OBJECTIVE:  BP 122/76   LMP 07/16/2019  The patient appears well, alert, oriented x 3, in no distress. PELVIC EXAM: Deferred as previously performed at recent visit  Pelvic ultrasound Uterus 5.9 x 4.0 x 2.8 cm, retroverted No myometrial masses Endometrial thickness 7.4 mm No polyps or abnormal thickening Normal shaped endometrial cavity and 3D imaging Bilateral ovaries with multiple small peripheral follicles 16-01 bilaterally, meeting PCOS criteria No adnexal masses.  No free fluid.   ASSESSMENT:  24 y.o. G0P0 with infertility, presumably due to anovulation with polycystic ovarian syndrome  PLAN:  1. We discussed the PCOS diagnosis as confirmed by her menstrual history and the appearance of her ovaries on ultrasound today.  Printouts with information on PCOS are provided to the patient. We reviewed her initial fertility lab results.  Her [place] was elevated.  Otherwise her other labs were within normal limits, except for a very mildly elevated testosterone level  which is not clinically significant and consistent with the confirmation of PCOS today.  Her uterus on ultrasound appears normal.  2. I provided the patient with my handout on Clomid that reviews use of the medication proper timing of obtaining lab work and suggested timing of intercourse, and medication side effects.   Common side effects could include night sweats, hot flashes, vaginal dryness, headache, dizziness, nausea, stomach upset, bloating, and mood changes.   She understands that cycle day 1 is the first day of menstrual bleeding.    Clomid is to be taken on cycle day 5 through 9.   I recommend timing of intercourse to every other day on cycle days 12, 14, 16.   In at least the first cycle I recommend checking a day 21 progesterone level to give some indication of whether or not ovulation occurred.   I reviewed the risk of using Clomid with obtaining multiple gestation about 2-3 times the baseline risk with natural conception.   Multiple gestation pregnancies are considered to be high risk with higher risk for complications including preterm delivery and/or maternal medical complications.   We can perform up to 6 cycles of Clomid therapy with escalating doses as indicated.  If the patient is unable to achieve conception and there are no other findings on the remainder of the patient's infertility workup, at that point I would recommend referral to REI.    3. We will be checking AMH today as her [place] was elevated.  If AMH is in a normal range, we will proceed with Clomid therapy and send her a prescription for that.  We did also discuss that slight weight loss and Metformin  can help resume ovulatory activity.  She preferred to just go ahead and try the Clomid, willing to do this understanding we have not yet completed HSG.  Her husband also should consider doing a semen analysis.  We will check back on these tests and consider doing them if she is not conceiving on the Clomid in the first several cycles.   I recommended switching to a daily prenatal vitamin.   Theresia Majors MD 09/04/19

## 2019-09-04 NOTE — Patient Instructions (Addendum)
Clomid & Provera Use  Potential side effects of Clomid include hot flashes, night sweating, vaginal dryness, mood changes, headache, bloating, nausea, upset stomach.    Provera is to be taken once daily for 10 days.  Expect a period within 7 days of stopping the medication.  Please be on a prenatal vitamin before starting Clomid.  Clomid is to be taken once daily on cycle days 5 through 9 of the menstrual cycle (cycle day 1 is the first day of the period).  If you do not normally get a period, then it is okay to start Clomid at any point as long as your pregnancy test is negative.  Time intercourse for mid cycle on cycle days 12, 14 and 16, and any other time outside that timeframe that as desired.    Call into the office so we can schedule day 21 progesterone level to see if ovulation occurred   If the next period skips, check a pregnancy test to ensure that it is negative before moving onto the next cycle of Clomid.   Call [place] office at or send a Portal Message to request another refill of Clomid if needed.        Polycystic Ovarian Syndrome  Polycystic ovarian syndrome (PCOS) is a common hormonal disorder among women of reproductive age. In most women with PCOS, many small fluid-filled sacs (cysts) grow on the ovaries, and the cysts are not part of a normal menstrual cycle. PCOS can cause problems with your menstrual periods and make it difficult to get pregnant. It can also cause an increased risk of miscarriage with pregnancy. If it is not treated, PCOS can lead to serious health problems, such as diabetes and heart disease. What are the causes? The cause of PCOS is not known, but it may be the result of a combination of certain factors, such as:  Irregular menstrual cycle.  High levels of certain hormones (androgens).  Problems with the hormone that helps to control blood sugar (insulin resistance).  Certain genes. What increases the risk? This condition is more likely to  develop in women who have a family history of PCOS. What are the signs or symptoms? Symptoms of PCOS may include:  Multiple ovarian cysts.  Infrequent periods or no periods.  Periods that are too frequent or too heavy.  Unpredictable periods.  Inability to get pregnant (infertility) because of not ovulating.  Increased growth of hair on the face, chest, stomach, back, thumbs, thighs, or toes.  Acne or oily skin. Acne may develop during adulthood, and it may not respond to treatment.  Pelvic pain.  Weight gain or obesity.  Patches of thickened and dark brown or black skin on the neck, arms, breasts, or thighs (acanthosis nigricans).  Excess hair growth on the face, chest, abdomen, or upper thighs (hirsutism). How is this diagnosed? This condition is diagnosed based on:  Your medical history.  A physical exam, including a pelvic exam. Your health care provider may look for areas of increased hair growth on your skin.  Tests, such as: ? Ultrasound. This may be used to examine the ovaries and the lining of the uterus (endometrium) for cysts. ? Blood tests. These may be used to check levels of sugar (glucose), female hormone (testosterone), and female hormones (estrogen and progesterone) in your blood. How is this treated? There is no cure for PCOS, but treatment can help to manage symptoms and prevent more health problems from developing. Treatment varies depending on:  Your symptoms.  Whether  you want to have a baby or whether you need birth control (contraception). Treatment may include nutrition and lifestyle changes along with:  Progesterone hormone to start a menstrual period.  Birth control pills to help you have regular menstrual periods.  Medicines to make you ovulate, if you want to get pregnant.  Medicine to reduce excessive hair growth.  Surgery, in severe cases. This may involve making small holes in one or both of your ovaries. This decreases the amount of  testosterone that your body produces. Follow these instructions at home:  Take over-the-counter and prescription medicines only as told by your health care provider.  Follow a healthy meal plan. This can help you reduce the effects of PCOS. ? Eat a healthy diet that includes lean proteins, complex carbohydrates, fresh fruits and vegetables, low-fat dairy products, and healthy fats. Make sure to eat enough fiber.  If you are overweight, lose weight as told by your health care provider. ? Losing 10% of your body weight may improve symptoms. ? Your health care provider can determine how much weight loss is best for you and can help you lose weight safely.  Keep all follow-up visits as told by your health care provider. This is important. Contact a health care provider if:  Your symptoms do not get better with medicine.  You develop new symptoms. This information is not intended to replace advice given to you by your health care provider. Make sure you discuss any questions you have with your health care provider. Document Revised: 04/07/2017 Document Reviewed: 10/11/2015 Elsevier Patient Education  2020 Elsevier Inc.  Diet for Polycystic Ovary Syndrome Polycystic ovary syndrome (PCOS) is a disorder of the chemicals (hormones) that regulate a woman's reproductive system, including monthly periods (menstruation). The condition causes important hormones to be out of balance. PCOS can:  Stop your periods or make them irregular.  Cause cysts to develop on your ovaries.  Make it difficult to get pregnant.  Stop your body from responding to the effects of insulin (insulin resistance). Insulin resistance can lead to obesity and diabetes. Changing what you eat can help you manage PCOS and improve your health. Following a balanced diet can help you lose weight and improve the way that your body uses insulin. What are tips for following this plan?  Follow a balanced diet for meals and snacks.  Eat breakfast, lunch, dinner, and one or two snacks every day.  Include protein in each meal and snack.  Choose whole grains instead of products that are made with refined flour.  Eat a variety of foods.  Exercise regularly as told by your health care provider. Aim to do 30 or more minutes of exercise on most days of the week.  If you are overweight or obese: ? Pay attention to how many calories you eat. Cutting down on calories can help you lose weight. ? Work with your health care provider or a diet and nutrition specialist (dietitian) to figure out how many calories you need each day. What foods can I eat?  Fruits Include a variety of colors and types. All fruits are helpful for PCOS. Vegetables Include a variety of colors and types. All vegetables are helpful for PCOS. Grains Whole grains, such as whole wheat. Whole-grain breads, crackers, cereals, and pasta. Unsweetened oatmeal, bulgur, barley, quinoa, and brown rice. Tortillas made from corn or whole-wheat flour. Meats and other proteins Low-fat (lean) proteins, such as fish, chicken, beans, eggs, and tofu. Dairy Low-fat dairy products, such as skim  milk, cheese sticks, and yogurt. Beverages Low-fat or fat-free drinks, such as water, low-fat milk, sugar-free drinks, and small amounts of 100% fruit juice. Seasonings and condiments Ketchup. Mustard. Barbecue sauce. Relish. Low-fat or fat-free mayonnaise. Fats and oils Olive oil or canola oil. Walnuts and almonds. The items listed above may not be a complete list of recommended foods and beverages. Contact a dietitian for more options. What foods are not recommended? Foods that are high in calories or fat. Fried foods. Sweets. Products that are made from refined white flour, including white bread, pastries, white rice, and pasta. The items listed above may not be a complete list of foods and beverages to avoid. Contact a dietitian for more information. Summary  PCOS is a  hormonal imbalance that affects a woman's reproductive system.  You can help to manage your PCOS by exercising regularly and eating a healthy, varied diet of vegetables, fruit, whole grains, low-fat (lean) protein, and low-fat dairy products.  Changing what you eat can improve the way that your body uses insulin, help your hormones reach normal levels, and help you lose weight. This information is not intended to replace advice given to you by your health care provider. Make sure you discuss any questions you have with your health care provider. Document Revised: 08/15/2018 Document Reviewed: 02/27/2017 Elsevier Patient Education  2020 [employer].

## 2019-09-08 LAB — ANTI-MULLERIAN HORMONE (AMH), FEMALE: Anti-Mullerian Hormones(AMH), Female: 18.63 ng/mL — ABNORMAL HIGH (ref 1.02–14.63)

## 2019-09-11 ENCOUNTER — Encounter: Payer: Self-pay | Admitting: Obstetrics and Gynecology

## 2019-09-12 ENCOUNTER — Other Ambulatory Visit: Payer: Self-pay | Admitting: Obstetrics and Gynecology

## 2019-09-12 MED ORDER — MEDROXYPROGESTERONE ACETATE 10 MG PO TABS: 10.0000 mg | ORAL_TABLET | Freq: Every day | ORAL | 2 refills | Status: DC

## 2019-09-12 MED ORDER — CLOMIPHENE CITRATE 50 MG PO TABS: 50.0000 mg | ORAL_TABLET | Freq: Every day | ORAL | 0 refills | Status: DC

## 2019-09-13 ENCOUNTER — Telehealth: Payer: Self-pay | Admitting: *Deleted

## 2019-09-13 ENCOUNTER — Other Ambulatory Visit: Payer: Self-pay | Admitting: Obstetrics and Gynecology

## 2019-09-13 NOTE — Telephone Encounter (Signed)
Dr.Kendall just wanted to relay the pharmacist sent a message stating "PLEASE SENT RX FOR 10 TABLETS. THAT IS THE SMALLEST PACKAGE SIZE THAT THIS MEDICATION COMES IN. THANKS CTH

## 2019-09-13 NOTE — Telephone Encounter (Signed)
Error

## 2019-12-18 ENCOUNTER — Other Ambulatory Visit: Payer: Self-pay

## 2019-12-18 DIAGNOSIS — N926 Irregular menstruation, unspecified: Secondary | ICD-10-CM

## 2019-12-18 DIAGNOSIS — E282 Polycystic ovarian syndrome: Secondary | ICD-10-CM

## 2019-12-30 ENCOUNTER — Ambulatory Visit: Payer: Self-pay | Attending: Internal Medicine

## 2019-12-30 DIAGNOSIS — Z23 Encounter for immunization: Secondary | ICD-10-CM

## 2019-12-30 NOTE — Progress Notes (Signed)
   Covid-19 Vaccination Clinic  Name:  Brooke Haley    MRN: [id number] DOB: 29-Oct-1995  12/30/2019  [name] was observed post Covid-19 immunization for 15 minutes without incident. She was provided with Vaccine Information Sheet and instruction to access the V-Safe system.   [name] was instructed to call 911 with any severe reactions post vaccine: Marland Kitchen Difficulty breathing  . Swelling of face and throat  . A fast heartbeat  . A bad rash all over body  . Dizziness and weakness   Immunizations Administered    Name Date Dose VIS Date Route   Pfizer COVID-19 Vaccine 12/30/2019  2:17 PM 0.3 mL 07/03/2018 Intramuscular   Manufacturer: [employer], [employer]   Lot: [id number]   NDC: [address]-4

## 2020-01-02 ENCOUNTER — Other Ambulatory Visit (INDEPENDENT_AMBULATORY_CARE_PROVIDER_SITE_OTHER): Payer: BC Managed Care – PPO

## 2020-01-02 ENCOUNTER — Other Ambulatory Visit: Payer: Self-pay

## 2020-01-02 DIAGNOSIS — N926 Irregular menstruation, unspecified: Secondary | ICD-10-CM | POA: Diagnosis not present

## 2020-01-02 DIAGNOSIS — E282 Polycystic ovarian syndrome: Secondary | ICD-10-CM

## 2020-01-02 LAB — PROGESTERONE: Progesterone: 6.7 ng/mL

## 2020-01-14 ENCOUNTER — Encounter: Payer: Self-pay | Admitting: Obstetrics and Gynecology

## 2020-01-15 MED ORDER — CLOMIPHENE CITRATE 50 MG PO TABS: 100.0000 mg | ORAL_TABLET | Freq: Every day | ORAL | 0 refills | Status: AC

## 2020-01-16 ENCOUNTER — Other Ambulatory Visit: Payer: Self-pay | Admitting: *Deleted

## 2020-01-16 DIAGNOSIS — N979 Female infertility, unspecified: Secondary | ICD-10-CM

## 2020-01-20 ENCOUNTER — Ambulatory Visit: Payer: Self-pay

## 2020-01-20 ENCOUNTER — Ambulatory Visit: Payer: BC Managed Care – PPO | Attending: Internal Medicine

## 2020-01-20 DIAGNOSIS — Z23 Encounter for immunization: Secondary | ICD-10-CM

## 2020-01-20 NOTE — Progress Notes (Signed)
   Covid-19 Vaccination Clinic  Name:  Brooke Haley    MRN: [id number] DOB: [birth year]  01/20/2020  Brooke Haley was observed post Covid-19 immunization for 15 minutes without incident. She was provided with Vaccine Information Sheet and instruction to access the V-Safe system.   Brooke Haley was instructed to call 911 with any severe reactions post vaccine: Marland Kitchen Difficulty breathing  . Swelling of face and throat  . A fast heartbeat  . A bad rash all over body  . Dizziness and weakness   Immunizations Administered    Name Date Dose VIS Date Route   Pfizer COVID-19 Vaccine 01/20/2020  3:42 PM 0.3 mL 07/03/2018 Intramuscular   Manufacturer: [employer], [employer]   Lot: [id number]   NDC: [address]-0

## 2020-02-03 ENCOUNTER — Other Ambulatory Visit: Payer: BC Managed Care – PPO

## 2020-02-03 ENCOUNTER — Other Ambulatory Visit: Payer: Self-pay

## 2020-02-03 DIAGNOSIS — N979 Female infertility, unspecified: Secondary | ICD-10-CM | POA: Diagnosis not present

## 2020-02-03 LAB — PROGESTERONE: Progesterone: 14.1 ng/mL

## 2020-02-04 ENCOUNTER — Encounter: Payer: Self-pay | Admitting: Obstetrics and Gynecology

## 2020-02-04 DIAGNOSIS — N979 Female infertility, unspecified: Secondary | ICD-10-CM

## 2020-02-04 NOTE — Telephone Encounter (Signed)
Can we have Sierah's husband do a semen analysis and I would recommend Lissy have an HSG if no success with Clomid after the next cycle

## 2020-02-20 ENCOUNTER — Ambulatory Visit
Admission: RE | Admit: 2020-02-20 | Discharge: 2020-02-20 | Disposition: A | Discharge status: Home / Self Care | Payer: BC Managed Care – PPO | Source: Ambulatory Visit | Attending: Obstetrics and Gynecology | Admitting: Obstetrics and Gynecology

## 2020-02-20 ENCOUNTER — Encounter: Payer: Self-pay | Admitting: Obstetrics and Gynecology

## 2020-02-20 DIAGNOSIS — N979 Female infertility, unspecified: Secondary | ICD-10-CM | POA: Diagnosis not present

## 2020-03-23 ENCOUNTER — Encounter: Payer: Self-pay | Admitting: Obstetrics and Gynecology

## 2020-03-31 MED ORDER — CLOMIPHENE CITRATE 50 MG PO TABS: 100.0000 mg | ORAL_TABLET | Freq: Every day | ORAL | 0 refills | Status: DC

## 2020-03-31 NOTE — Addendum Note (Signed)
Addended by: Aura Camps on: 03/31/2020 03:30 PM   Modules accepted: Orders

## 2020-03-31 NOTE — Telephone Encounter (Signed)
Lab order placed for day 21 progesterone level

## 2020-03-31 NOTE — Telephone Encounter (Signed)
Check day 21 progesterone level this month

## 2020-04-06 ENCOUNTER — Telehealth: Payer: Self-pay | Admitting: *Deleted

## 2020-04-06 NOTE — Telephone Encounter (Signed)
PA done for clomiphene 50 mcg tablet done via cover my meds, will wait for response.

## 2020-05-24 ENCOUNTER — Encounter: Payer: Self-pay | Admitting: Obstetrics and Gynecology

## 2020-07-01 ENCOUNTER — Encounter: Payer: Self-pay | Admitting: Obstetrics and Gynecology

## 2020-07-02 MED ORDER — MEDROXYPROGESTERONE ACETATE 10 MG PO TABS: 10.0000 mg | ORAL_TABLET | Freq: Every day | ORAL | 0 refills | Status: DC

## 2020-07-02 MED ORDER — CLOMIPHENE CITRATE 50 MG PO TABS: 150.0000 mg | ORAL_TABLET | Freq: Every day | ORAL | 0 refills | Status: AC

## 2020-07-02 NOTE — Telephone Encounter (Signed)
Are any of our other physicians doing fertility care in their practice? Can we help Brooke Haley transition over to one of them after I leave?  I think it would be good for her to have an appointment with the new doctor taking over her care to review what has been done so far with fertility management. Thanks!

## 2020-07-02 NOTE — Telephone Encounter (Signed)
Thanks

## 2020-07-07 ENCOUNTER — Telehealth: Payer: Self-pay | Admitting: *Deleted

## 2020-07-07 NOTE — Telephone Encounter (Signed)
PA done online for clomid 50 mg tablet,  [employer] [place] denied medication. Per [employer] infertility medication is not covered by insurance.

## 2020-07-30 ENCOUNTER — Other Ambulatory Visit: Payer: Self-pay

## 2020-07-30 ENCOUNTER — Other Ambulatory Visit (INDEPENDENT_AMBULATORY_CARE_PROVIDER_SITE_OTHER): Payer: BC Managed Care – PPO

## 2020-07-30 DIAGNOSIS — N979 Female infertility, unspecified: Secondary | ICD-10-CM | POA: Diagnosis not present

## 2020-07-30 LAB — PROGESTERONE: Progesterone: 8.7 ng/mL

## 2020-08-05 ENCOUNTER — Encounter: Payer: Self-pay | Admitting: Obstetrics and Gynecology

## 2020-08-05 ENCOUNTER — Other Ambulatory Visit: Payer: Self-pay

## 2020-08-05 ENCOUNTER — Ambulatory Visit (INDEPENDENT_AMBULATORY_CARE_PROVIDER_SITE_OTHER): Payer: BC Managed Care – PPO | Admitting: Obstetrics and Gynecology

## 2020-08-05 VITALS — BP 122/68 | HR 88 | Ht 59.0 in | Wt 196.0 lb

## 2020-08-05 DIAGNOSIS — Z3169 Encounter for other general counseling and advice on procreation: Secondary | ICD-10-CM

## 2020-08-05 NOTE — Progress Notes (Signed)
GYNECOLOGY  VISIT   HPI: 25 y.o.   Married   American [place] or [place] Native [place] or Caucasian Not Hispanic or Latino  female   G0P0 with Patient's last menstrual period was 07/09/2020.   here for fertility consult    She was seen by Dr Penni Bombard for fertility issues. H/O PCOS and oligomenorrhea. Normal TSH, prolactin, AMH.  In 10/21 she had a normal HSG 11/21 husband's semen analysis showed 0% normal morphology. Dr April Manson recommended a vitamin regimen.   She did Clomid 50 mg x 2, then 100 mg x 2. Ovulated each time. Has had 3 progesterone levels c/w ovulation. Having intercourse every other day. Hasn't been doing ovulation predictor kits.      GYNECOLOGIC HISTORY: Patient's last menstrual period was 07/09/2020. Contraception: none  Menopausal hormone therapy: none         OB History    Gravida  0   Para  0   Term      Preterm      AB      Living  0     SAB      IAB      Ectopic      Multiple      Live Births                 Patient Active Problem List   Diagnosis Date Noted  . Polycystic ovarian syndrome 09/04/2019  . Female infertility 09/04/2019  . Sore throat 10/26/2017  . HSP ([place] purpura) (HCC) 10/25/2017  . Advance care planning 11/01/2016  . RUQ abdominal pain 11/01/2016    Past Medical History:  Diagnosis Date  . Pityriasis versicolor   . RSV (respiratory syncytial virus pneumonia) 05/1994   In-patient    Past Surgical History:  Procedure Laterality Date  . BLADDER SURGERY  1996   to straighten ureter  . EYE SURGERY  1996   BILATERAL; amblyopia  . KIDNEY SURGERY  1995   cystectomy & ureter repair    Current Outpatient Medications  Medication Sig Dispense Refill  . Prenatal MV-Min-Fe Fum-FA-DHA (PRENATAL MULTI +DHA) 27-0.8-200 MG CAPS     . Probiotic Product (PROBIOTIC PO) Take by mouth.     No current facility-administered medications for this visit.     ALLERGIES: Patient has no known allergies.  Family  History  Problem Relation Age of Onset  . Brain cancer Mother   . Cancer Paternal Grandmother        Lung cancer    Social History   Socioeconomic History  . Marital status: Married    Spouse name: Not on file  . Number of children: Not on file  . Years of education: Not on file  . Highest education level: Not on file  Occupational History  . Not on file  Tobacco Use  . Smoking status: Never Smoker  . Smokeless tobacco: Never Used  Vaping Use  . Vaping Use: Never used  Substance and Sexual Activity  . Alcohol use: Yes    Alcohol/week: 0.0 standard drinks    Comment: rare  . Drug use: No  . Sexual activity: Yes    Birth control/protection: None    Comment: 1st intercourse 12 yo-5 partners  Other Topics Concern  . Not on file  Social History Narrative   [employer] grad 2014   [employer] grad 2018, sociology major   single   Social Determinants of Health   Financial Resource Strain: Not on [employer] Insecurity: Not on  file  Transportation Needs: Not on file  Physical Activity: Not on file  Stress: Not on file  Social Connections: Not on file  Intimate Partner Violence: Not on file    Review of Systems  Neurological: Negative for sensory change.  All other systems reviewed and are negative.   PHYSICAL EXAMINATION:    BP 122/68   Pulse 88   Ht 4\' 11"  (1.499 m)   Wt 196 lb (88.9 kg)   LMP 07/09/2020   SpO2 99%   BMI 39.59 kg/m     General appearance: alert, cooperative and appears stated age  27. Infertility counseling The patient has a h/o PCOS and oligomenorrhea with a normal HSG. She has done 4 courses of clomid each with ovulation and no pregnancy. Her husband also had a semen analysis with 0% normal morphology. I told her that typically the dose of clomid doesn't need to be increased once ovulation is demonstrated Discussed that letrozol may be a better option for her than clomid (more live birth rates). Given her ovulation on clomid and her husbands semen  analysis, I have recommended that she go to the infertility clinic for more options.

## 2020-08-10 ENCOUNTER — Encounter: Payer: Self-pay | Admitting: Obstetrics and Gynecology

## 2020-08-13 DIAGNOSIS — N390 Urinary tract infection, site not specified: Secondary | ICD-10-CM | POA: Diagnosis not present

## 2020-08-19 ENCOUNTER — Encounter: Payer: BC Managed Care – PPO | Admitting: Obstetrics and Gynecology

## 2020-08-30 ENCOUNTER — Other Ambulatory Visit: Payer: Self-pay | Admitting: Family Medicine

## 2020-08-30 DIAGNOSIS — E282 Polycystic ovarian syndrome: Secondary | ICD-10-CM

## 2020-08-31 ENCOUNTER — Other Ambulatory Visit (INDEPENDENT_AMBULATORY_CARE_PROVIDER_SITE_OTHER): Payer: BC Managed Care – PPO

## 2020-08-31 ENCOUNTER — Other Ambulatory Visit: Payer: Self-pay

## 2020-08-31 DIAGNOSIS — E282 Polycystic ovarian syndrome: Secondary | ICD-10-CM

## 2020-08-31 LAB — CBC WITH DIFFERENTIAL/PLATELET
Basophils Absolute: 0 10*3/uL (ref 0.0–0.1)
Basophils Relative: 0.4 % (ref 0.0–3.0)
Eosinophils Absolute: 0.1 10*3/uL (ref 0.0–0.7)
Eosinophils Relative: 1.2 % (ref 0.0–5.0)
HCT: 42.7 % (ref 36.0–46.0)
Hemoglobin: 14.3 g/dL (ref 12.0–15.0)
Lymphocytes Relative: 28.8 % (ref 12.0–46.0)
Lymphs Abs: 2.7 10*3/uL (ref 0.7–4.0)
MCHC: 33.6 g/dL (ref 30.0–36.0)
MCV: 77.2 fl — ABNORMAL LOW (ref 78.0–100.0)
Monocytes Absolute: 0.4 10*3/uL (ref 0.1–1.0)
Monocytes Relative: 4.5 % (ref 3.0–12.0)
Neutro Abs: 6.1 10*3/uL (ref 1.4–7.7)
Neutrophils Relative %: 65.1 % (ref 43.0–77.0)
Platelets: 363 10*3/uL (ref 150.0–400.0)
RBC: 5.53 Mil/uL — ABNORMAL HIGH (ref 3.87–5.11)
RDW: 14.2 % (ref 11.5–15.5)
WBC: 9.4 10*3/uL (ref 4.0–10.5)

## 2020-08-31 LAB — BASIC METABOLIC PANEL
BUN: 12 mg/dL (ref 6–23)
CO2: 23 mEq/L (ref 19–32)
Calcium: 9.1 mg/dL (ref 8.4–10.5)
Chloride: 106 mEq/L (ref 96–112)
Creatinine, Ser: 0.58 mg/dL (ref 0.40–1.20)
GFR: 124.88 mL/min (ref >60.00)
Glucose, Bld: 89 mg/dL (ref 70–99)
Potassium: 4 mEq/L (ref 3.5–5.1)
Sodium: 137 mEq/L (ref 135–145)

## 2020-09-11 ENCOUNTER — Other Ambulatory Visit: Payer: Self-pay

## 2020-09-11 ENCOUNTER — Encounter: Payer: Self-pay | Admitting: Family Medicine

## 2020-09-11 ENCOUNTER — Ambulatory Visit (INDEPENDENT_AMBULATORY_CARE_PROVIDER_SITE_OTHER): Payer: BC Managed Care – PPO | Admitting: Family Medicine

## 2020-09-11 VITALS — BP 122/80 | HR 83 | Temp 97.7°F | Ht 59.0 in | Wt 192.0 lb

## 2020-09-11 DIAGNOSIS — R300 Dysuria: Secondary | ICD-10-CM | POA: Diagnosis not present

## 2020-09-11 DIAGNOSIS — Z23 Encounter for immunization: Secondary | ICD-10-CM | POA: Diagnosis not present

## 2020-09-11 DIAGNOSIS — Z0001 Encounter for general adult medical examination with abnormal findings: Secondary | ICD-10-CM

## 2020-09-11 DIAGNOSIS — Z0000 Encounter for general adult medical examination without abnormal findings: Secondary | ICD-10-CM

## 2020-09-11 DIAGNOSIS — Z7189 Other specified counseling: Secondary | ICD-10-CM

## 2020-09-11 DIAGNOSIS — N979 Female infertility, unspecified: Secondary | ICD-10-CM

## 2020-09-11 LAB — POC URINALSYSI DIPSTICK (AUTOMATED)
Bilirubin, UA: NEGATIVE
Blood, UA: NEGATIVE
Glucose, UA: NEGATIVE
Ketones, UA: NEGATIVE
Leukocytes, UA: NEGATIVE
Nitrite, UA: NEGATIVE
Protein, UA: NEGATIVE
Spec Grav, UA: 1.01 (ref 1.010–1.025)
Urobilinogen, UA: 0.2 E.U./dL
pH, UA: 6 (ref 5.0–8.0)

## 2020-09-11 MED ORDER — CEPHALEXIN 500 MG PO CAPS: 500.0000 mg | ORAL_CAPSULE | Freq: Two times a day (BID) | ORAL | 0 refills | Status: DC

## 2020-09-11 NOTE — Patient Instructions (Signed)
  We'll update you about your urine culture.  If more symptoms in the meantime, then start keflex.  Take care.  Glad to see you.

## 2020-09-11 NOTE — Progress Notes (Signed)
This visit occurred during the SARS-CoV-2 public health emergency.  Safety protocols were in place, including screening questions prior to the visit, additional usage of staff PPE, and extensive cleaning of exam room while observing appropriate contact time as indicated for disinfecting solutions.  CPE- See plan.  Routine anticipatory guidance given to patient.  See health maintenance.  The possibility exists that previously documented standard health maintenance information may have been brought forward from a previous encounter into this note.  If needed, that same information has been updated to reflect the current situation based on today's encounter.    She is going to see fertility clinic in August of this year.    Tdap 2022 PNA and shingles not due.  Flu shot due in the fall, d/w pt.   Covid vaccine done. Pap per gyn.  Colon cancer screening and breast cancer screening not due.  Advance directive d/w pt.  Husband designated if patient were incapacitated.   HIV and HCV screening d/w pt.  She'll consider.  We can order in the future if not done in the meantime.   She had HPV vaccine prev.   Diet and exercise d/w pt.  She is exercising a few times a week and generally on a lower carb diet.  She is using a tread climber and using kettle bells.    Recently used macrobid for UTI.  Was doing well until this AM.  This AM with burning.  Urgency.  Intermittent sx, comes and goes and "right now I feel okay."  H/o UTI in years past.  Finished abx more than 1 week ago, about 2-3 weeks ago.    PMH and SH reviewed  Meds, vitals, and allergies reviewed.   ROS: Per HPI.  Unless specifically indicated otherwise in HPI, the patient denies:  General: fever. Eyes: acute vision changes ENT: sore throat Cardiovascular: chest pain Respiratory: SOB GI: vomiting GU: dysuria Musculoskeletal: acute back pain Derm: acute rash Neuro: acute motor dysfunction Psych: worsening mood Endocrine:  polydipsia Heme: bleeding Allergy: hayfever  GEN: nad, alert and oriented HEENT: ncat NECK: supple w/o LA CV: rrr. PULM: ctab, no inc wob ABD: soft, +bs, not tender to palpation. EXT: no edema SKIN: no acute rash

## 2020-09-12 LAB — URINE CULTURE
MICRO NUMBER:: 11859997
SPECIMEN QUALITY:: ADEQUATE

## 2020-09-13 DIAGNOSIS — Z0000 Encounter for general adult medical examination without abnormal findings: Secondary | ICD-10-CM | POA: Insufficient documentation

## 2020-09-13 DIAGNOSIS — R300 Dysuria: Secondary | ICD-10-CM | POA: Insufficient documentation

## 2020-09-13 NOTE — Assessment & Plan Note (Signed)
She is going to see fertility clinic in August of this year.    Tdap 2022 PNA and shingles not due.  Flu shot due in the fall, d/w pt.   Covid vaccine done. Pap per gyn.  Colon cancer screening and breast cancer screening not due.  Advance directive d/w pt.  Husband designated if patient were incapacitated.   HIV and HCV screening d/w pt.  She'll consider.  We can order in the future if not done in the meantime.   She had HPV vaccine prev.   Diet and exercise d/w pt.  She is exercising a few times a week and generally on a lower carb diet.  She is using a tread climber and using kettle bells.

## 2020-09-13 NOTE — Assessment & Plan Note (Signed)
She is going to follow-up with fertility clinic later this year.

## 2020-09-13 NOTE — Assessment & Plan Note (Signed)
I gave her prescription for Keflex to hold in the meantime.  If she has worsening symptoms she can start it.  Routine cautions given to patient.  See notes on labs.

## 2020-09-13 NOTE — Assessment & Plan Note (Signed)
Advance directive- d/w pt. Husband designated if patient were incapacitated.  

## 2020-09-17 ENCOUNTER — Encounter: Payer: Self-pay | Admitting: Family Medicine

## 2020-11-10 ENCOUNTER — Other Ambulatory Visit: Payer: Self-pay

## 2020-11-10 ENCOUNTER — Ambulatory Visit (INDEPENDENT_AMBULATORY_CARE_PROVIDER_SITE_OTHER): Payer: BC Managed Care – PPO | Admitting: Obstetrics and Gynecology

## 2020-11-10 ENCOUNTER — Encounter: Payer: Self-pay | Admitting: Obstetrics and Gynecology

## 2020-11-10 VITALS — BP 118/74 | HR 88 | Ht 59.0 in | Wt 195.0 lb

## 2020-11-10 DIAGNOSIS — E282 Polycystic ovarian syndrome: Secondary | ICD-10-CM | POA: Diagnosis not present

## 2020-11-10 DIAGNOSIS — N926 Irregular menstruation, unspecified: Secondary | ICD-10-CM

## 2020-11-10 DIAGNOSIS — Z01419 Encounter for gynecological examination (general) (routine) without abnormal findings: Secondary | ICD-10-CM | POA: Diagnosis not present

## 2020-11-10 DIAGNOSIS — Z8742 Personal history of other diseases of the female genital tract: Secondary | ICD-10-CM

## 2020-11-10 NOTE — Patient Instructions (Signed)

## 2020-11-10 NOTE — Progress Notes (Signed)
25 y.o. G0P0 Married   American [place] or [place] Native [place] or Caucasian Not Hispanic or Latino female here for annual exam.   Period Duration (Days): 4-5 Period Pattern: (!) Irregular Menstrual Flow: Moderate Menstrual Control: Tampon Menstrual Control Change Freq (Hours): 3-4 Dysmenorrhea: (!) Mild Dysmenorrhea Symptoms: Cramping  H/O PCOS, oligomenorrhea, infertility. Normal TSH, prolactin, AMH, HSG. Semen analysis in 11/21 with 0% normal morphology, Dr April Manson recommended a vitamin regimen.  She has used clomid x 4 each time with ovulation.   She last used clomid in March. Since then her cycles have gone from 40 to 35 then 28. Coming spontaneously.   At her last visit here in 3/22, it was recommended that she f/u with Dr Lyndal Rainbow office. She has an appointment in 8/22.   No bowel or bladder c/o.   Patient's last menstrual period was 10/18/2020.          Sexually active: Yes.    The current method of family planning is none.    Exercising: No.  The patient does not participate in regular exercise at present. Smoker:  no  Health Maintenance: Pap:  08/15/2018 Normal  History of abnormal Pap:  no MMG:  None BMD:   none  Colonoscopy: none  TDaP:  09/11/20 Gardasil: Complete    reports that she has never smoked. She has never used smokeless tobacco. She reports current alcohol use. She reports that she does not use drugs. She has a 0-2 drinks a week. 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   Past Medical History:  Diagnosis Date   Pityriasis versicolor    RSV (respiratory syncytial virus pneumonia) 05/1994   In-patient    Past Surgical History:  Procedure Laterality Date   BLADDER SURGERY  1996   to straighten ureter   EYE SURGERY  1996   BILATERAL; amblyopia   KIDNEY SURGERY  1995   cystectomy & ureter repair    Current Outpatient Medications  Medication Sig Dispense Refill   Prenatal MV-Min-Fe Fum-FA-DHA (PRENATAL MULTI +DHA) 27-0.8-200 MG CAPS       Probiotic Product (PROBIOTIC PO) Take by mouth.     No current facility-administered medications for this visit.    Family History  Problem Relation Age of Onset   Brain cancer Mother    Cancer Paternal Grandmother        Lung cancer    Review of Systems  All other systems reviewed and are negative.  Exam:   BP 118/74   Pulse 88   Ht 4\' 11"  (1.499 m)   Wt 195 lb (88.5 kg)   LMP 10/18/2020   SpO2 100%   BMI 39.39 kg/m   Weight change: @WEIGHTCHANGE @ Height:   Height: 4\' 11"  (149.9 cm)  Ht Readings from Last 3 Encounters:  11/10/20 4\' 11"  (1.499 m)  09/11/20 4\' 11"  (1.499 m)  08/05/20 4\' 11"  (1.499 m)    General appearance: alert, cooperative and appears stated age Head: Normocephalic, without obvious abnormality, atraumatic Neck: no adenopathy, supple, symmetrical, trachea midline and thyroid normal to inspection and palpation Lungs: clear to auscultation bilaterally Cardiovascular: regular rate and rhythm Breasts: normal appearance, no masses or tenderness Abdomen: soft, non-tender; non distended,  no masses,  no organomegaly Extremities: extremities normal, atraumatic, no cyanosis or edema Skin: Skin color, texture, turgor normal. No rashes or lesions Lymph nodes: Cervical, supraclavicular, and axillary nodes normal. No abnormal inguinal nodes palpated Neurologic: Grossly normal   Pelvic: External genitalia:  no lesions  Urethra:  normal appearing urethra with no masses, tenderness or lesions              Bartholins and Skenes: normal                 Vagina: normal appearing vagina with normal color and discharge, no lesions              Cervix: no lesions               Bimanual Exam:  Uterus:   no masses or tenderness              Adnexa: no mass, fullness, tenderness               Rectovaginal: Confirms               Anus:  normal sphincter tone, no lesions  Carolynn Serve, CMA chaperoned for the exam.  1. Well woman exam Normal exam Labs  UTD with primary Pap next year  2. Polycystic ovarian syndrome   3. Irregular menses Recently cycling on her own  4. 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 She ovulated with 50 mg of clomid F/U at the infertility clinic next month On PNV

## 2020-12-16 DIAGNOSIS — E282 Polycystic ovarian syndrome: Secondary | ICD-10-CM | POA: Diagnosis not present

## 2020-12-16 DIAGNOSIS — Z3169 Encounter for other general counseling and advice on procreation: Secondary | ICD-10-CM | POA: Diagnosis not present

## 2020-12-16 DIAGNOSIS — Z3141 Encounter for fertility testing: Secondary | ICD-10-CM | POA: Diagnosis not present

## 2021-01-05 DIAGNOSIS — Z3169 Encounter for other general counseling and advice on procreation: Secondary | ICD-10-CM | POA: Diagnosis not present

## 2021-01-19 DIAGNOSIS — E221 Hyperprolactinemia: Secondary | ICD-10-CM | POA: Diagnosis not present

## 2021-02-02 DIAGNOSIS — Z3200 Encounter for pregnancy test, result unknown: Secondary | ICD-10-CM | POA: Diagnosis not present

## 2021-02-08 DIAGNOSIS — Z3200 Encounter for pregnancy test, result unknown: Secondary | ICD-10-CM | POA: Diagnosis not present

## 2021-02-22 DIAGNOSIS — O0900 Supervision of pregnancy with history of infertility, unspecified trimester: Secondary | ICD-10-CM | POA: Diagnosis not present

## 2021-03-12 LAB — OB RESULTS CONSOLE ABO/RH: RH Type: POSITIVE

## 2021-03-12 LAB — OB RESULTS CONSOLE HIV ANTIBODY (ROUTINE TESTING): HIV: NONREACTIVE

## 2021-03-12 LAB — OB RESULTS CONSOLE RUBELLA ANTIBODY, IGM: Rubella: IMMUNE

## 2021-03-12 LAB — OB RESULTS CONSOLE RPR: RPR: NONREACTIVE

## 2021-03-12 LAB — OB RESULTS CONSOLE ANTIBODY SCREEN: Antibody Screen: NEGATIVE

## 2021-03-12 LAB — OB RESULTS CONSOLE HEPATITIS B SURFACE ANTIGEN: Hepatitis B Surface Ag: NEGATIVE

## 2021-03-15 LAB — OB RESULTS CONSOLE GC/CHLAMYDIA
Chlamydia: NEGATIVE
Gonorrhea: NEGATIVE

## 2021-09-01 LAB — OB RESULTS CONSOLE GBS: GBS: NEGATIVE

## 2021-09-09 ENCOUNTER — Encounter (HOSPITAL_COMMUNITY): Payer: Self-pay

## 2021-09-09 ENCOUNTER — Encounter (HOSPITAL_COMMUNITY): Payer: Self-pay | Admitting: *Deleted

## 2021-09-09 ENCOUNTER — Telehealth (HOSPITAL_COMMUNITY): Payer: Self-pay | Admitting: *Deleted

## 2021-09-09 NOTE — Telephone Encounter (Signed)
Preadmission screen  

## 2021-09-09 NOTE — Patient Instructions (Signed)
Brooke Haley ? 09/09/2021 ? ? Your procedure is scheduled on:  09/21/2021 ? Arrive at [facility] on [address] at [facility]  and [address]. You are invited to use the FREE valet parking or use the Visitor's parking deck. ? Pick up the phone at the desk and dial [phone]. ? Call this number if you have problems the morning of surgery: [phone] ? Remember: ? ? Do not eat food:(After Midnight) Desp?s de medianoche. ? Do not drink clear liquids: (After Midnight) Desp?s de medianoche. ? Take these medicines the morning of surgery with A SIP OF WATER:  none ? ? Do not wear jewelry, make-up or nail polish. ? Do not wear lotions, powders, or perfumes. Do not wear deodorant. ? Do not shave 48 hours prior to surgery. ? Do not bring valuables to the hospital.  [facility] is not  ? responsible for any belongings or valuables brought to the hospital. ? Contacts, dentures or bridgework may not be worn into surgery. ? Leave suitcase in the car. After surgery it may be brought to your room. ? For patients admitted to the hospital, checkout time is 11:00 AM the day of  ?            discharge. ? ?   ? Please read over the following fact sheets that you were given:  ?   Preparing for Surgery ? ? ?

## 2021-09-10 ENCOUNTER — Encounter (HOSPITAL_COMMUNITY): Payer: Self-pay

## 2021-09-10 ENCOUNTER — Other Ambulatory Visit: Payer: Self-pay | Admitting: Obstetrics and Gynecology

## 2021-09-15 ENCOUNTER — Encounter (HOSPITAL_COMMUNITY): Payer: Self-pay

## 2021-09-15 NOTE — Patient Instructions (Signed)
Brooke Haley ? 09/15/2021 ? ? Your procedure is scheduled on:  09/16/2021 ? Arrive at 0900 at [facility] on [address] at [facility]  and [address]. You are invited to use the FREE valet parking or use the Visitor's parking deck. ? Pick up the phone at the desk and dial [phone]. ? Call this number if you have problems the morning of surgery: [phone] ? Remember: ? ? Do not eat food:(After Midnight) Desp?s de medianoche. ? Do not drink clear liquids: (After Midnight) Desp?s de medianoche. ? Take these medicines the morning of surgery with A SIP OF WATER:  none ? ? Do not wear jewelry, make-up or nail polish. ? Do not wear lotions, powders, or perfumes. Do not wear deodorant. ? Do not shave 48 hours prior to surgery. ? Do not bring valuables to the hospital.  [facility] is not  ? responsible for any belongings or valuables brought to the hospital. ? Contacts, dentures or bridgework may not be worn into surgery. ? Leave suitcase in the car. After surgery it may be brought to your room. ? For patients admitted to the hospital, checkout time is 11:00 AM the day of  ?            discharge. ? ?   ? Please read over the following fact sheets that you were given:  ?   Preparing for Surgery ? ? ?

## 2021-09-16 ENCOUNTER — Encounter (HOSPITAL_COMMUNITY): Payer: Self-pay | Admitting: Obstetrics and Gynecology

## 2021-09-16 ENCOUNTER — Encounter (HOSPITAL_COMMUNITY)
Admission: RE | Disposition: A | Discharge status: Home / Self Care | Payer: Self-pay | Source: Home / Self Care | Attending: Obstetrics and Gynecology

## 2021-09-16 ENCOUNTER — Other Ambulatory Visit: Payer: Self-pay

## 2021-09-16 ENCOUNTER — Inpatient Hospital Stay (HOSPITAL_COMMUNITY): Payer: No Typology Code available for payment source | Admitting: Anesthesiology

## 2021-09-16 ENCOUNTER — Inpatient Hospital Stay (HOSPITAL_COMMUNITY)
Admission: RE | Admit: 2021-09-16 | Discharge: 2021-09-18 | DRG: 787 | Disposition: A | Discharge status: Home / Self Care | Payer: No Typology Code available for payment source | Attending: Obstetrics and Gynecology | Admitting: Obstetrics and Gynecology

## 2021-09-16 DIAGNOSIS — Z3A37 37 weeks gestation of pregnancy: Secondary | ICD-10-CM

## 2021-09-16 DIAGNOSIS — D62 Acute posthemorrhagic anemia: Secondary | ICD-10-CM | POA: Diagnosis not present

## 2021-09-16 DIAGNOSIS — O134 Gestational [pregnancy-induced] hypertension without significant proteinuria, complicating childbirth: Secondary | ICD-10-CM

## 2021-09-16 DIAGNOSIS — O30049 Twin pregnancy, dichorionic/diamniotic, unspecified trimester: Secondary | ICD-10-CM | POA: Diagnosis present

## 2021-09-16 DIAGNOSIS — O321XX1 Maternal care for breech presentation, fetus 1: Secondary | ICD-10-CM

## 2021-09-16 DIAGNOSIS — O30043 Twin pregnancy, dichorionic/diamniotic, third trimester: Secondary | ICD-10-CM | POA: Diagnosis present

## 2021-09-16 DIAGNOSIS — O322XX2 Maternal care for transverse and oblique lie, fetus 2: Secondary | ICD-10-CM | POA: Diagnosis present

## 2021-09-16 DIAGNOSIS — O99214 Obesity complicating childbirth: Secondary | ICD-10-CM | POA: Diagnosis present

## 2021-09-16 DIAGNOSIS — O9081 Anemia of the puerperium: Secondary | ICD-10-CM | POA: Diagnosis not present

## 2021-09-16 DIAGNOSIS — O139 Gestational [pregnancy-induced] hypertension without significant proteinuria, unspecified trimester: Secondary | ICD-10-CM | POA: Diagnosis present

## 2021-09-16 LAB — CBC
HCT: 29.5 % — ABNORMAL LOW (ref 36.0–46.0)
Hemoglobin: 10.1 g/dL — ABNORMAL LOW (ref 12.0–15.0)
MCH: 23.7 pg — ABNORMAL LOW (ref 26.0–34.0)
MCHC: 34.2 g/dL (ref 30.0–36.0)
MCV: 69.2 fL — ABNORMAL LOW (ref 80.0–100.0)
Platelets: 281 10*3/uL (ref 150–400)
RBC: 4.26 MIL/uL (ref 3.87–5.11)
RDW: 16.2 % — ABNORMAL HIGH (ref 11.5–15.5)
WBC: 10.5 10*3/uL (ref 4.0–10.5)
nRBC: 0 % (ref 0.0–0.2)

## 2021-09-16 LAB — RPR: RPR Ser Ql: NONREACTIVE

## 2021-09-16 LAB — TYPE AND SCREEN
ABO/RH(D): A POS
Antibody Screen: NEGATIVE

## 2021-09-16 SURGERY — Surgical Case
Anesthesia: Spinal | Wound class: Clean Contaminated

## 2021-09-16 MED ORDER — TETANUS-DIPHTH-ACELL PERTUSSIS 5-2.5-18.5 LF-MCG/0.5 IM SUSY: 0.5000 mL | PREFILLED_SYRINGE | Freq: Once | INTRAMUSCULAR | Status: DC

## 2021-09-16 MED ORDER — MENTHOL 3 MG MT LOZG: 1.0000 | LOZENGE | OROMUCOSAL | Status: DC | PRN

## 2021-09-16 MED ORDER — SIMETHICONE 80 MG PO CHEW
80.0000 mg | CHEWABLE_TABLET | Freq: Three times a day (TID) | ORAL | Status: DC
  Administered 2021-09-17 – 2021-09-18 (×5): 80 mg via ORAL
  Filled 2021-09-16 (×5): qty 1

## 2021-09-16 MED ORDER — NALOXONE HCL 0.4 MG/ML IJ SOLN: 0.4000 mg | INTRAMUSCULAR | Status: DC | PRN

## 2021-09-16 MED ORDER — WITCH HAZEL-GLYCERIN EX PADS: 1.0000 "application " | MEDICATED_PAD | CUTANEOUS | Status: DC | PRN

## 2021-09-16 MED ORDER — PHENYLEPHRINE 80 MCG/ML (10ML) SYRINGE FOR IV PUSH (FOR BLOOD PRESSURE SUPPORT)
PREFILLED_SYRINGE | INTRAVENOUS | Status: AC
  Filled 2021-09-16: qty 20

## 2021-09-16 MED ORDER — SCOPOLAMINE 1 MG/3DAYS TD PT72
MEDICATED_PATCH | TRANSDERMAL | Status: AC
  Filled 2021-09-16: qty 1

## 2021-09-16 MED ORDER — PHENYLEPHRINE HCL-NACL 20-0.9 MG/250ML-% IV SOLN
INTRAVENOUS | Status: DC | PRN
  Administered 2021-09-16: 30 ug/min via INTRAVENOUS

## 2021-09-16 MED ORDER — CEFAZOLIN SODIUM-DEXTROSE 2-4 GM/100ML-% IV SOLN
2.0000 g | INTRAVENOUS | Status: AC
  Administered 2021-09-16: 2 g via INTRAVENOUS

## 2021-09-16 MED ORDER — OXYTOCIN-SODIUM CHLORIDE 30-0.9 UT/500ML-% IV SOLN
INTRAVENOUS | Status: AC
  Filled 2021-09-16: qty 500

## 2021-09-16 MED ORDER — CHLORHEXIDINE GLUCONATE 0.12 % MT SOLN
OROMUCOSAL | Status: AC
  Filled 2021-09-16: qty 15

## 2021-09-16 MED ORDER — CHLORHEXIDINE GLUCONATE 0.12 % MT SOLN
15.0000 mL | Freq: Once | OROMUCOSAL | Status: AC
  Administered 2021-09-16: 15 mL via OROMUCOSAL

## 2021-09-16 MED ORDER — ACETAMINOPHEN 10 MG/ML IV SOLN
INTRAVENOUS | Status: AC
  Filled 2021-09-16: qty 100

## 2021-09-16 MED ORDER — MEPERIDINE HCL 25 MG/ML IJ SOLN: 6.2500 mg | INTRAMUSCULAR | Status: DC | PRN

## 2021-09-16 MED ORDER — PHENYLEPHRINE HCL-NACL 20-0.9 MG/250ML-% IV SOLN
INTRAVENOUS | Status: AC
  Filled 2021-09-16: qty 250

## 2021-09-16 MED ORDER — FENTANYL CITRATE (PF) 100 MCG/2ML IJ SOLN
INTRAMUSCULAR | Status: DC | PRN
Start: 2021-09-16 — End: 2021-09-16
  Administered 2021-09-16: 15 ug via INTRAVENOUS

## 2021-09-16 MED ORDER — ACETAMINOPHEN 500 MG PO TABS
1000.0000 mg | ORAL_TABLET | Freq: Four times a day (QID) | ORAL | Status: AC
  Administered 2021-09-16 – 2021-09-17 (×3): 1000 mg via ORAL
  Filled 2021-09-16 (×3): qty 2

## 2021-09-16 MED ORDER — MORPHINE SULFATE (PF) 0.5 MG/ML IJ SOLN
INTRAMUSCULAR | Status: AC
  Filled 2021-09-16: qty 10

## 2021-09-16 MED ORDER — SCOPOLAMINE 1 MG/3DAYS TD PT72
1.0000 | MEDICATED_PATCH | Freq: Once | TRANSDERMAL | Status: DC
  Administered 2021-09-16: 1.5 mg via TRANSDERMAL

## 2021-09-16 MED ORDER — POVIDONE-IODINE 10 % EX SWAB
2.0000 "application " | Freq: Once | CUTANEOUS | Status: AC
  Administered 2021-09-16: 2 via TOPICAL

## 2021-09-16 MED ORDER — NALOXONE HCL 4 MG/10ML IJ SOLN
1.0000 ug/kg/h | INTRAVENOUS | Status: DC | PRN
  Filled 2021-09-16: qty 5

## 2021-09-16 MED ORDER — OXYTOCIN-SODIUM CHLORIDE 30-0.9 UT/500ML-% IV SOLN
INTRAVENOUS | Status: DC | PRN
Start: 2021-09-16 — End: 2021-09-16
  Administered 2021-09-16: 400 mL via INTRAVENOUS

## 2021-09-16 MED ORDER — DEXAMETHASONE SODIUM PHOSPHATE 10 MG/ML IJ SOLN
INTRAMUSCULAR | Status: AC
  Filled 2021-09-16: qty 1

## 2021-09-16 MED ORDER — DEXAMETHASONE SODIUM PHOSPHATE 10 MG/ML IJ SOLN
INTRAMUSCULAR | Status: DC | PRN
Start: 2021-09-16 — End: 2021-09-16
  Administered 2021-09-16: 5 mg via INTRAVENOUS

## 2021-09-16 MED ORDER — SOD CITRATE-CITRIC ACID 500-334 MG/5ML PO SOLN
30.0000 mL | Freq: Once | ORAL | Status: AC
  Administered 2021-09-16: 30 mL via ORAL

## 2021-09-16 MED ORDER — ONDANSETRON HCL 4 MG/2ML IJ SOLN
INTRAMUSCULAR | Status: DC | PRN
  Administered 2021-09-16: 4 mg via INTRAVENOUS

## 2021-09-16 MED ORDER — DIBUCAINE (PERIANAL) 1 % EX OINT
1.0000 | TOPICAL_OINTMENT | CUTANEOUS | Status: DC | PRN
Start: 2021-09-16 — End: 2021-09-18

## 2021-09-16 MED ORDER — DIPHENHYDRAMINE HCL 25 MG PO CAPS: 25.0000 mg | ORAL_CAPSULE | ORAL | Status: DC | PRN

## 2021-09-16 MED ORDER — ONDANSETRON HCL 4 MG/2ML IJ SOLN: 4.0000 mg | Freq: Three times a day (TID) | INTRAMUSCULAR | Status: DC | PRN

## 2021-09-16 MED ORDER — BUPIVACAINE IN DEXTROSE 0.75-8.25 % IT SOLN
INTRATHECAL | Status: DC | PRN
Start: 2021-09-16 — End: 2021-09-16
  Administered 2021-09-16: 1.4 mL via INTRATHECAL

## 2021-09-16 MED ORDER — LACTATED RINGERS IV SOLN: INTRAVENOUS | Status: DC

## 2021-09-16 MED ORDER — OXYTOCIN-SODIUM CHLORIDE 30-0.9 UT/500ML-% IV SOLN: 2.5000 [IU]/h | INTRAVENOUS | Status: AC

## 2021-09-16 MED ORDER — FENTANYL CITRATE (PF) 100 MCG/2ML IJ SOLN
INTRAMUSCULAR | Status: AC
  Filled 2021-09-16: qty 2

## 2021-09-16 MED ORDER — SODIUM CHLORIDE 0.9% FLUSH: 3.0000 mL | INTRAVENOUS | Status: DC | PRN

## 2021-09-16 MED ORDER — TRANEXAMIC ACID-NACL 1000-0.7 MG/100ML-% IV SOLN
INTRAVENOUS | Status: AC
  Filled 2021-09-16: qty 100

## 2021-09-16 MED ORDER — CEFAZOLIN SODIUM-DEXTROSE 2-4 GM/100ML-% IV SOLN
INTRAVENOUS | Status: AC
  Filled 2021-09-16: qty 100

## 2021-09-16 MED ORDER — ONDANSETRON HCL 4 MG/2ML IJ SOLN: 4.0000 mg | Freq: Once | INTRAMUSCULAR | Status: DC | PRN

## 2021-09-16 MED ORDER — PRENATAL MULTIVITAMIN CH
1.0000 | ORAL_TABLET | Freq: Every day | ORAL | Status: DC
  Administered 2021-09-17 – 2021-09-18 (×2): 1 via ORAL
  Filled 2021-09-16 (×2): qty 1

## 2021-09-16 MED ORDER — KETOROLAC TROMETHAMINE 30 MG/ML IJ SOLN
30.0000 mg | Freq: Four times a day (QID) | INTRAMUSCULAR | Status: DC | PRN
  Administered 2021-09-17: 30 mg via INTRAVENOUS
  Filled 2021-09-16: qty 1

## 2021-09-16 MED ORDER — DIPHENHYDRAMINE HCL 25 MG PO CAPS: 25.0000 mg | ORAL_CAPSULE | Freq: Four times a day (QID) | ORAL | Status: DC | PRN

## 2021-09-16 MED ORDER — DIPHENHYDRAMINE HCL 50 MG/ML IJ SOLN: 12.5000 mg | INTRAMUSCULAR | Status: DC | PRN

## 2021-09-16 MED ORDER — PHENYLEPHRINE 80 MCG/ML (10ML) SYRINGE FOR IV PUSH (FOR BLOOD PRESSURE SUPPORT)
PREFILLED_SYRINGE | INTRAVENOUS | Status: DC | PRN
  Administered 2021-09-16: 80 ug via INTRAVENOUS

## 2021-09-16 MED ORDER — KETOROLAC TROMETHAMINE 30 MG/ML IJ SOLN
30.0000 mg | Freq: Four times a day (QID) | INTRAMUSCULAR | Status: DC | PRN
  Administered 2021-09-16: 30 mg via INTRAMUSCULAR

## 2021-09-16 MED ORDER — COCONUT OIL OIL: 1.0000 "application " | TOPICAL_OIL | Status: DC | PRN

## 2021-09-16 MED ORDER — SIMETHICONE 80 MG PO CHEW
80.0000 mg | CHEWABLE_TABLET | ORAL | Status: DC | PRN
Start: 2021-09-16 — End: 2021-09-18

## 2021-09-16 MED ORDER — MORPHINE SULFATE (PF) 0.5 MG/ML IJ SOLN
INTRAMUSCULAR | Status: DC | PRN
  Administered 2021-09-16: 150 ug via EPIDURAL

## 2021-09-16 MED ORDER — SCOPOLAMINE 1 MG/3DAYS TD PT72: 1.0000 | MEDICATED_PATCH | Freq: Once | TRANSDERMAL | Status: DC

## 2021-09-16 MED ORDER — FAMOTIDINE 20 MG PO TABS
20.0000 mg | ORAL_TABLET | Freq: Once | ORAL | Status: AC
  Administered 2021-09-16: 20 mg via ORAL

## 2021-09-16 MED ORDER — ORAL CARE MOUTH RINSE: 15.0000 mL | Freq: Once | OROMUCOSAL | Status: AC

## 2021-09-16 MED ORDER — FENTANYL CITRATE (PF) 100 MCG/2ML IJ SOLN: 25.0000 ug | INTRAMUSCULAR | Status: DC | PRN

## 2021-09-16 MED ORDER — SENNOSIDES-DOCUSATE SODIUM 8.6-50 MG PO TABS
2.0000 | ORAL_TABLET | Freq: Every day | ORAL | Status: DC
  Administered 2021-09-17 – 2021-09-18 (×2): 2 via ORAL
  Filled 2021-09-16 (×2): qty 2

## 2021-09-16 MED ORDER — ONDANSETRON HCL 4 MG/2ML IJ SOLN
INTRAMUSCULAR | Status: AC
  Filled 2021-09-16: qty 2

## 2021-09-16 MED ORDER — TRANEXAMIC ACID 1000 MG/10ML IV SOLN
INTRAVENOUS | Status: DC | PRN
  Administered 2021-09-16: 1000 mg via INTRAVENOUS

## 2021-09-16 MED ORDER — SOD CITRATE-CITRIC ACID 500-334 MG/5ML PO SOLN
ORAL | Status: AC
  Filled 2021-09-16: qty 30

## 2021-09-16 MED ORDER — ACETAMINOPHEN 10 MG/ML IV SOLN
INTRAVENOUS | Status: DC | PRN
  Administered 2021-09-16: 1000 mg via INTRAVENOUS

## 2021-09-16 MED ORDER — KETOROLAC TROMETHAMINE 30 MG/ML IJ SOLN
INTRAMUSCULAR | Status: AC
  Filled 2021-09-16: qty 1

## 2021-09-16 MED ORDER — FAMOTIDINE 20 MG PO TABS
ORAL_TABLET | ORAL | Status: AC
  Filled 2021-09-16: qty 1

## 2021-09-16 SURGICAL SUPPLY — 42 items
APL SKNCLS STERI-STRIP NONHPOA (GAUZE/BANDAGES/DRESSINGS) ×1
BENZOIN TINCTURE PRP APPL 2/3 (GAUZE/BANDAGES/DRESSINGS) ×2 IMPLANT
CHLORAPREP W/TINT 26ML (MISCELLANEOUS) ×4 IMPLANT
CLAMP CORD UMBIL (MISCELLANEOUS) ×2 IMPLANT
CLOTH BEACON ORANGE TIMEOUT ST (SAFETY) ×2 IMPLANT
DRESSING PREVENA PLUS CUSTOM (GAUZE/BANDAGES/DRESSINGS) IMPLANT
DRSG OPSITE POSTOP 4X10 (GAUZE/BANDAGES/DRESSINGS) ×2 IMPLANT
DRSG PREVENA PLUS CUSTOM (GAUZE/BANDAGES/DRESSINGS) ×2
ELECT REM PT RETURN 9FT ADLT (ELECTROSURGICAL) ×2
ELECTRODE REM PT RTRN 9FT ADLT (ELECTROSURGICAL) ×1 IMPLANT
EXTRACTOR VACUUM KIWI (MISCELLANEOUS) ×2 IMPLANT
GLOVE BIOGEL PI IND STRL 6 (GLOVE) ×1 IMPLANT
GLOVE BIOGEL PI IND STRL 6.5 (GLOVE) ×1 IMPLANT
GLOVE BIOGEL PI IND STRL 7.0 (GLOVE) ×2 IMPLANT
GLOVE BIOGEL PI INDICATOR 6 (GLOVE) ×1
GLOVE BIOGEL PI INDICATOR 6.5 (GLOVE) ×1
GLOVE BIOGEL PI INDICATOR 7.0 (GLOVE) ×2
GOWN STRL REUS W/TWL LRG LVL3 (GOWN DISPOSABLE) ×4 IMPLANT
KIT ABG SYR 3ML LUER SLIP (SYRINGE) IMPLANT
MAT PREVALON FULL STRYKER (MISCELLANEOUS) ×1 IMPLANT
NDL HYPO 25X5/8 SAFETYGLIDE (NEEDLE) IMPLANT
NEEDLE HYPO 25X5/8 SAFETYGLIDE (NEEDLE) IMPLANT
NS IRRIG 1000ML POUR BTL (IV SOLUTION) ×2 IMPLANT
PACK C SECTION WH (CUSTOM PROCEDURE TRAY) ×2 IMPLANT
PAD OB MATERNITY 4.3X12.25 (PERSONAL CARE ITEMS) ×2 IMPLANT
RETRACTOR TRAXI PANNICULUS (MISCELLANEOUS) IMPLANT
RETRACTOR WND ALEXIS 25 LRG (MISCELLANEOUS) ×1 IMPLANT
RTRCTR WOUND ALEXIS 25CM LRG (MISCELLANEOUS) ×2
STRIP CLOSURE SKIN 1/2X4 (GAUZE/BANDAGES/DRESSINGS) IMPLANT
SUT MNCRL 0 VIOLET CTX 36 (SUTURE) ×2 IMPLANT
SUT MONOCRYL 0 CTX 36 (SUTURE) ×4
SUT PLAIN 2 0 XLH (SUTURE) ×1 IMPLANT
SUT VIC AB 0 CT1 36 (SUTURE) ×2 IMPLANT
SUT VIC AB 2-0 CT1 27 (SUTURE) ×4
SUT VIC AB 2-0 CT1 TAPERPNT 27 (SUTURE) IMPLANT
SUT VIC AB 3-0 CT1 27 (SUTURE)
SUT VIC AB 3-0 CT1 TAPERPNT 27 (SUTURE) IMPLANT
SUT VIC AB 4-0 PS2 27 (SUTURE) ×2 IMPLANT
TOWEL OR 17X24 6PK STRL BLUE (TOWEL DISPOSABLE) ×2 IMPLANT
TRAXI PANNICULUS RETRACTOR (MISCELLANEOUS) ×1
TRAY FOLEY W/BAG SLVR 14FR LF (SET/KITS/TRAYS/PACK) IMPLANT
WATER STERILE IRR 1000ML POUR (IV SOLUTION) ×2 IMPLANT

## 2021-09-16 NOTE — Anesthesia Procedure Notes (Signed)
Spinal ? ?Patient location during procedure: OR ?Start time: 09/16/2021 12:35 PM ?End time: 09/16/2021 12:38 PM ?Reason for block: surgical anesthesia ?Staffing ?Performed: anesthesiologist  ?Anesthesiologist: Collene Schlichter, MD ?Preanesthetic Checklist ?Completed: patient identified, IV checked, risks and benefits discussed, surgical consent, monitors and equipment checked, pre-op evaluation and timeout performed ?Spinal Block ?Patient position: sitting ?Prep: DuraPrep and site prepped and draped ?Patient monitoring: continuous pulse ox and blood pressure ?Approach: midline ?Location: L3-4 ?Injection technique: single-shot ?Needle ?Needle type: Pencan  ?Needle gauge: 24 G ?Assessment ?Sensory level: T6 ?Events: CSF return ?Additional Notes ?Functioning IV was confirmed and monitors were applied. Sterile prep and drape, including hand hygiene, mask and sterile gloves were used. The patient was positioned and the spine was prepped. The skin was anesthetized with lidocaine.  Free flow of clear CSF was obtained prior to injecting local anesthetic into the CSF.  The spinal needle aspirated freely following injection.  The needle was carefully withdrawn.  The patient tolerated the procedure well. Consent was obtained prior to procedure with all questions answered and concerns addressed. Risks including but not limited to bleeding, infection, nerve damage, paralysis, failed block, inadequate analgesia, allergic reaction, high spinal, itching and headache were discussed and the patient wished to proceed.  ? ?Arrie Aran, MD ? ? ? ?

## 2021-09-16 NOTE — Lactation Note (Signed)
This note was copied from a baby's chart. ?Lactation Consultation Note ? ?Patient Name: Brooke Haley ?Today's Date: 09/16/2021 ?Reason for consult: Initial assessment;Primapara;1st time breastfeeding;Early term 37-38.6wks;Multiple gestation;Breastfeeding assistance ?Age:28 hours ? ?P1, Early Term Infants, Twins  ? ?LC to patient room per RN's request. Both babies were sleeping when LC entered the room. Mom states that babies have not been interested in feeding. LC spoke with mom about infant behavior on day 1 on taught her how to hand express. LC informed mom that she could hand express into a spoon and feed any drops that she gets back to babies.  LC did not note any drops when hand expressing. Mom also hand expressed on the left side and no drops were noted.  ? ?LC spoke with mom about feeding according to feeding cues.  ? ?LC encouraged mom to get some rest while the babies were resting and to hand express for stimulation if the babies are not latching. Mom is encouraged to call for lactation assistance when babies are showing feeding cues.   ? ? ?Lactation Tools Discussed/Used ?  ? ?Interventions ?Interventions: Breast feeding basics reviewed;Hand express;Education ? ?Discharge ?  ? ?Consult Status ?Consult Status: Follow-up ?Date: 09/16/21 ? ? ? ?Jamaul Heist P Avleen Bordwell ?09/16/2021, 5:46 PM ? ? ? ?

## 2021-09-16 NOTE — Addendum Note (Signed)
Addendum  created 09/16/21 1627 by Collene Schlichter, MD  ? Clinical Note Signed  ?  ?

## 2021-09-16 NOTE — H&P (Signed)
Brooke Haley is a 26 y.o. G1P0 at [redacted]w[redacted]d gestation presents for c/s.  Di/Di twins with concordant growth, was planned for c/s at 30 wga, but new diagnosis of GHTN yesterday and meets criteria for delivery.  Patient denies h/a, vaginal bleeding, lof, abd pain, vision changes, contractions. +FM x2 ? ?Antepartum course:  di/di twins, concordant growth, br/oblique; mild anemia-iron q day; failed 1hr gtt, passed 3hr gtt ; bmi >30;  ?[facility]are at [facility] since 10 wks. ? ?See complete pre-natal records ? ?History ?OB History   ? ? Gravida  ?1  ? Para  ?0  ? Term  ?   ? Preterm  ?   ? AB  ?   ? Living  ?0  ?  ? ? SAB  ?   ? IAB  ?   ? Ectopic  ?   ? Multiple  ?   ? Live Births  ?   ?   ?  ?  ? ?Past Medical History:  ?Diagnosis Date  ? Pityriasis versicolor   ? RSV (respiratory syncytial virus pneumonia) 05/09/1994  ? In-patient  ? ?Past Surgical History:  ?Procedure Laterality Date  ? BLADDER SURGERY  05/09/1994  ? to straighten ureter  ? EYE SURGERY  05/09/1994  ? BILATERAL; amblyopia  ? KIDNEY SURGERY  1995  ? cystectomy & ureter repair  ? WISDOM TOOTH EXTRACTION    ? ?Family History: family history includes Brain cancer in her mother; Cancer in her paternal grandmother. ?Social History:  reports that she has never smoked. She has never used smokeless tobacco. She reports current alcohol use. She reports that she does not use drugs. ? ?ROS: See above otherwise negative ? ?Prenatal labs: ? ?ABO, Rh: --/--/A POS (05/11 1004) ?Antibody: NEG (05/11 1004) ?Rubella: Immune (11/04 0000) ?RPR: Nonreactive (11/04 0000)  ?HBsAg: Negative (11/04 0000)  ?HIV:Non-reactive (11/04 0000)  ?GBS: Negative/-- (04/26 0000)  ?1 hr Glucola:  failled, passed 3 hr gtt ?Genetic screening: Normal ?Anatomy US: Normal ? ?Physical Exam:  ? ?  ?Blood pressure (!) 143/87, temperature 98.7 ?F (37.1 ?C), temperature source Oral, resp. rate 18, height 4\' 11"  (1.499 m), weight 107 kg, last menstrual period 10/18/2020, SpO2 100 %. ?A&O x 3 ?HEENT:  Normal ?Lungs: CTAB ?CV: RRR ?Abdominal: Soft, Non-tender, and Gravid ?Lower Extremities: +1-2 LE edema, nt bilat ? ?Pelvic Exam: ? ?    deferred ? ?Labs: ? ?CBC:  ?Lab Results  ?Component Value Date  ? WBC 10.5 09/16/2021  ? RBC 4.26 09/16/2021  ? HGB 10.1 (L) 09/16/2021  ? HCT 29.5 (L) 09/16/2021  ? MCV 69.2 (L) 09/16/2021  ? MCH 23.7 (L) 09/16/2021  ? MCHC 34.2 09/16/2021  ? RDW 16.2 (H) 09/16/2021  ? PLT 281 09/16/2021  ? ?CMP:  ?Lab Results  ?Component Value Date  ? NA 137 08/31/2020  ? K 4.0 08/31/2020  ? CL 106 08/31/2020  ? CO2 23 08/31/2020  ? GLUCOSE 89 08/31/2020  ? BUN 12 08/31/2020  ? CREATININE 0.58 08/31/2020  ? CALCIUM 9.1 08/31/2020  ? PROT 6.8 08/07/2017  ? AST 12 08/07/2017  ? ALT 9 08/07/2017  ? ALBUMIN 4.1 10/31/2016  ? ALKPHOS 48 10/31/2016  ? BILITOT 0.5 08/07/2017  ? ?Urine: ?Lab Results  ?Component Value Date  ? COLORURINE YELLOW 08/15/2018  ? APPEARANCEUR CLEAR 08/15/2018  ? LABSPEC 1.016 08/15/2018  ? PHURINE 5.5 08/15/2018  ? GLUCOSEU NEGATIVE 08/15/2018  ? Needles NEGATIVE 08/15/2018  ? BILIRUBINUR neg 09/11/2020  ? Benjamin Stain TRACE (  A) 08/15/2018  ? PROTEINUR Negative 09/11/2020  ? NITRITE neg 09/11/2020  ? LEUKOCYTESUR Negative 09/11/2020  ? ? ? ?Prenatal Transfer Tool  ?Maternal Diabetes: No ?Genetic Screening: Normal ?Maternal Ultrasounds/Referrals: Normal ?Fetal Ultrasounds or other Referrals:  None ?Maternal Substance Abuse:  No ?Significant Maternal Medications:  None ?Significant Maternal Lab Results: Group B Strep negative ? ? ? ?Assessment/Plan:  26 y.o. G1P0 at [redacted]w[redacted]d gestation with Di/Di twins, GHTN ? ?Di/di twins with br/oblique presentations, now with GHTN - plan for c/s delivery; surgery reviewed with patient during antepartum visits and reviewed again, risk of bleeding (possible need for transfusion), infection, risk of injury to nerves/bowel, bladder, blood vessels; risk of further surgery, risk of anesthesia, risk of blood clot to leg/lung; consent signed, proceed to  OR ?Di/Di twins - concordant growth, reassuring nst/bpp x2 yesterday; last growth u/s at 34wga: a: 32% (5'0"), b: 35% (5'1") with 1% discordance; u/s yesterday with a breech, b oblique with head maternal right pelvis ?GHTN - mild range no meds, follow closely pp, asymptomatic ?Mild anemia - reassess pp, asymptomatic ?Gbs neg ?RH pos ?Bmi >30 at pregnancy onset ?Failed 1 hr gtt, passed 3 hr ? ?Charyl Bigger ?09/16/2021, 12:11 PM ? ?

## 2021-09-16 NOTE — Op Note (Signed)
Cesarean Section Procedure Note  ? ?Brooke Haley ? ?09/16/2021 ? ?Indications:  di/di twin gestation  breech/oblique; GHTN ? ?Pre-operative Diagnosis: Dichorionic-Diamniotic Twins; GHTN ? ?Post-operative Diagnosis: Same  ? ?Surgeon: Surgeon(s) and Role: ?   Murrell Redden Earlyne Iba, MD - Primary  ? ?Assistants: Derrell Lolling, CNM ? ?Anesthesia: epidural  ? ?Procedure Details:  ?The patient was seen in the Holding Room. The risks, benefits, complications, treatment options, and expected outcomes were discussed with the patient. The patient concurred with the proposed plan, giving informed consent. identified as Brooke Haley and the procedure verified as C-Section Delivery. A Time Out was held and the above information confirmed.  ?After induction of anesthesia, the patient was draped and prepped in the usual sterile manner, foley was draining urine well.  A pfannenstiel incision was made and carried down through the subcutaneous tissue to the fascia. Fascial incision was made and extended transversely. The fascia was separated from the underlying rectus tissue superiorly and inferiorly. The peritoneum was identified and entered. Peritoneal incision was extended longitudinally. Alexis-O retractor placed. The utero-vesical peritoneal reflection was incised transversely and the bladder flap was bluntly freed from the lower uterine segment. A low transverse uterine incision was made.  ?Delivered from frank breech  presentation was a viable female  infant with vigorous cry. Apgar scores of 8 at one minute and 9 at five minutes. The cord was clamped and cut and baby handed to NICU team in attendance. Attention was then drawn back to the uterus where amniotomy was then perfomed. Baby b was noted transverse with head maternal right, spine up.  The hips were grasped and brought toward the incision w/o difficulty.  Baby b was delivered from complete breech presentation w/o difficulty in standard fashion, a viable female  infant, apgars 8/9.  Cord ph was not sent. Cord blood was obtained for evaluation x2. The placentas were removed Intact, spontaneously and appeared normal. The uterine outline, tubes and ovaries appeared wnl. TXA was given prophylactically. The uterine incision was closed with running locked sutures of 0Vicryl. A second imbricating layer was sutured also with 0 vicryl.   Hemostasis was observed. During repair the alexis retractor expulsed from the abdomen.  Richardson retractor and bladder blade were then used. Peritoneal closure done with 2-0 Vicryl.  The fascia was then reapproximated with running sutures of 0Vicryl. The subcuticular closure was performed using 2-0plain gut. The skin was closed with 4-0Vicryl.  ? ?Instrument, sponge, and needle counts were correct prior the abdominal closure and were correct at the conclusion of the case.  ? ? ?Findings: viable female infant, apgars 8/9; viable female infant, apgars 8/9; normal uterus, nml ov/tubes bilaterally ?  ?Estimated Blood Loss: 891ml  ? ?Total IV Fluids: 2052ml  ? ?Urine Output:  200CC OF clear urine ? ?Specimens: none ? ?Complications: no complications ? ?Disposition: PACU - hemodynamically stable.  ? ?Maternal Condition: stable  ? ?Baby condition / location:  Couplet care / Skin to Skin ? ?Attending Attestation: I performed the procedure.  ? ?Signed: ?Surgeon(s): ?Lynnie Koehler, Earlyne Iba, MD ? ? ? ?  ? ?

## 2021-09-16 NOTE — Transfer of Care (Signed)
Immediate Anesthesia Transfer of Care Note ? ?Patient: Brooke Haley ? ?Procedure(s) Performed: Primary CESAREAN SECTION MULTI-GESTATIONAL ? ?Patient Location: PACU ? ?Anesthesia Type:Spinal ? ?Level of Consciousness: awake ? ?Airway & Oxygen Therapy: Patient Spontanous Breathing ? ?Post-op Assessment: Report given to RN ? ?Post vital signs: Reviewed and stable ? ?Last Vitals:  ?Vitals Value Taken Time  ?BP    ?Temp    ?Pulse 73 09/16/21 1452  ?Resp    ?SpO2 100 % 09/16/21 1452  ?Vitals shown include unvalidated device data. ? ?Last Pain:  ?Vitals:  ? 09/16/21 0929  ?TempSrc: Oral  ?   ? ?  ? ?Complications: No notable events documented. ?

## 2021-09-16 NOTE — Anesthesia Preprocedure Evaluation (Addendum)
Anesthesia Evaluation  ?Patient identified by MRN, date of birth, ID band ?Patient awake ? ? ? ?Reviewed: ?Allergy & Precautions, NPO status , Patient's Chart, lab work & pertinent test results ? ?Airway ?Mallampati: III ? ?TM Distance: >3 FB ?Neck ROM: Full ? ? ? Dental ? ?(+) Teeth Intact, Dental Advisory Given ?  ?Pulmonary ?neg pulmonary ROS,  ?  ?Pulmonary exam normal ?breath sounds clear to auscultation ? ? ? ? ? ? Cardiovascular ?negative cardio ROS ?Normal cardiovascular exam ?Rhythm:Regular Rate:Normal ? ? ?  ?Neuro/Psych ?negative neurological ROS ?   ? GI/Hepatic ?negative GI ROS, Neg liver ROS,   ?Endo/Other  ?Morbid obesity ? Renal/GU ?negative Renal ROS  ? ?  ?Musculoskeletal ?negative musculoskeletal ROS ?(+)  ? Abdominal ?  ?Peds ? Hematology ?negative hematology ROS ?(+)   ?Anesthesia Other Findings ?Day of surgery medications reviewed with the patient. ? Reproductive/Obstetrics ?(+) Pregnancy (Dichorionic-Diamniotic Twins) ? ?  ? ? ? ? ? ? ? ? ? ? ? ? ? ?  ?  ? ? ? ? ? ? ? ?Anesthesia Physical ?Anesthesia Plan ? ?ASA: 3 ? ?Anesthesia Plan: Spinal  ? ?Post-op Pain Management:   ? ?Induction: Intravenous ? ?PONV Risk Score and Plan: 2 and Dexamethasone, Ondansetron and Scopolamine patch - Pre-op ? ?Airway Management Planned: Natural Airway ? ?Additional Equipment:  ? ?Intra-op Plan:  ? ?Post-operative Plan:  ? ?Informed Consent: I have reviewed the patients History and Physical, chart, labs and discussed the procedure including the risks, benefits and alternatives for the proposed anesthesia with the patient or authorized representative who has indicated his/her understanding and acceptance.  ? ? ? ?Dental advisory given ? ?Plan Discussed with: CRNA, Anesthesiologist and Surgeon ? ?Anesthesia Plan Comments: (Discussed risks and benefits of and differences between spinal and general. Discussed risks of spinal including headache, backache, failure, bleeding, infection,  and nerve damage. Patient consents to spinal. Questions answered. Coagulation studies and platelet count acceptable.)  ? ? ? ? ? ?Anesthesia Quick Evaluation ? ?

## 2021-09-16 NOTE — Anesthesia Postprocedure Evaluation (Addendum)
Anesthesia Post Note ? ?Patient: Brooke Haley ? ?Procedure(s) Performed: Primary CESAREAN SECTION MULTI-GESTATIONAL ? ?  ? ?Patient location during evaluation: PACU ?Anesthesia Type: Spinal ?Level of consciousness: awake, awake and alert and oriented ?Pain management: pain level controlled ?Vital Signs Assessment: post-procedure vital signs reviewed and stable ?Respiratory status: spontaneous breathing, nonlabored ventilation and respiratory function stable ?Cardiovascular status: blood pressure returned to baseline and stable ?Postop Assessment: no headache, no backache, spinal receding and no apparent nausea or vomiting ?Anesthetic complications: no ? ? ?No notable events documented. ? ?Last Vitals:  ?Vitals:  ? 09/16/21 1545 09/16/21 1601  ?BP: (!) 119/91 131/66  ?Pulse: 65 65  ?Resp: 15 18  ?Temp:  36.8 ?C  ?SpO2: 98% 98%  ?  ?Last Pain:  ?Vitals:  ? 09/16/21 1605  ?TempSrc:   ?PainSc: 1   ? ?Pain Goal:   ? ?LLE Motor Response: Purposeful movement (09/16/21 1545) ?LLE Sensation: Numbness, Tingling (09/16/21 1545) ?RLE Motor Response: Purposeful movement (09/16/21 1545) ?RLE Sensation: Numbness, Tingling (09/16/21 1545) ?  ?  ?Epidural/Spinal Function Cutaneous sensation: Normal sensation (09/16/21 1605), Patient able to flex knees: Yes (09/16/21 1605), Patient able to lift hips off bed: Yes (09/16/21 1605), Back pain beyond tenderness at insertion site: No (09/16/21 1605), Progressively worsening motor and/or sensory loss: No (09/16/21 1605), Bowel and/or bladder incontinence post epidural: No (09/16/21 1605) ? ?Collene Schlichter ? ? ? ? ?

## 2021-09-17 LAB — CBC
HCT: 25.6 % — ABNORMAL LOW (ref 36.0–46.0)
Hemoglobin: 8.7 g/dL — ABNORMAL LOW (ref 12.0–15.0)
MCH: 23.5 pg — ABNORMAL LOW (ref 26.0–34.0)
MCHC: 34 g/dL (ref 30.0–36.0)
MCV: 69 fL — ABNORMAL LOW (ref 80.0–100.0)
Platelets: 296 10*3/uL (ref 150–400)
RBC: 3.71 MIL/uL — ABNORMAL LOW (ref 3.87–5.11)
RDW: 16.1 % — ABNORMAL HIGH (ref 11.5–15.5)
WBC: 16.2 10*3/uL — ABNORMAL HIGH (ref 4.0–10.5)
nRBC: 0.1 % (ref 0.0–0.2)

## 2021-09-17 MED ORDER — ACETAMINOPHEN 325 MG PO TABS
650.0000 mg | ORAL_TABLET | Freq: Four times a day (QID) | ORAL | Status: DC | PRN
  Administered 2021-09-17: 650 mg via ORAL
  Filled 2021-09-17: qty 2

## 2021-09-17 MED ORDER — OXYCODONE HCL 5 MG PO TABS: 10.0000 mg | ORAL_TABLET | ORAL | Status: DC | PRN

## 2021-09-17 MED ORDER — SODIUM CHLORIDE 0.9 % IV SOLN
INTRAVENOUS | Status: DC | PRN
Start: 2021-09-17 — End: 2021-09-18

## 2021-09-17 MED ORDER — OXYCODONE HCL 5 MG PO TABS: 5.0000 mg | ORAL_TABLET | ORAL | Status: DC | PRN

## 2021-09-17 MED ORDER — SODIUM CHLORIDE 0.9 % IV SOLN
500.0000 mg | Freq: Once | INTRAVENOUS | Status: AC
  Administered 2021-09-17: 500 mg via INTRAVENOUS
  Filled 2021-09-17: qty 25

## 2021-09-17 MED ORDER — HYDROCHLOROTHIAZIDE 12.5 MG PO TABS
6.2500 mg | ORAL_TABLET | Freq: Every day | ORAL | Status: DC
  Administered 2021-09-17 – 2021-09-18 (×2): 6.25 mg via ORAL
  Filled 2021-09-17 (×2): qty 1

## 2021-09-17 MED ORDER — IBUPROFEN 600 MG PO TABS
600.0000 mg | ORAL_TABLET | Freq: Four times a day (QID) | ORAL | Status: DC
  Administered 2021-09-17 – 2021-09-18 (×4): 600 mg via ORAL
  Filled 2021-09-17 (×4): qty 1

## 2021-09-17 NOTE — Progress Notes (Signed)
Per protocol, physician was called due to pt blood pressure being elevated above parameters 127/90, and after hour 137/67  Dr.stated she was "okay'' with those pressures and no new orders were place just to continue with routine blood pressures. Care to be continued. ? ?Eartha Inch RN  ?

## 2021-09-17 NOTE — Lactation Note (Signed)
This note was copied from a baby's chart. ?Lactation Consultation Note ? ?Patient Name: Brooke Haley ?Today's Date: 09/17/2021 ?Reason for consult: Follow-up assessment;Mother's request;Difficult latch;Primapara;1st time breastfeeding;Early term 37-38.6wks;Maternal endocrine disorder;Breastfeeding assistance;RN request ?Age:28 hours ? ?LC attempted a latch with Baby Boy A, would not latch. Infant fed 16 ml of colostrum switched to Nfant nipple due to leakage with yellow slow flow. (Infant following BF supplementation guide)  ? ?LC latched with Baby Girl B for 6 mins on and off with a few sucks. Again infant leakage with yellow and switched in Nfant nipple fed 27 ml by Father.  ? ?Plan 1. To feed based on cues 8-12x 24hr period. Mom to offer breasts and look for signs of milk transfer.  ?2. Mom to supplement with EBM first followed by formula with pace bottle feeding and Nfant nipple 10-20 ml per feeding. LPTI given and reviewed for baby girl.  ?3. DEBP q 3hrs for 15 min  ? ?All questions answered at the end of the visit.  ? ?Maternal Data ?Has patient been taught Hand Expression?: Yes ?Does the patient have breastfeeding experience prior to this delivery?: No ? ?Feeding ?Mother's Current Feeding Choice: Breast Milk and Formula ?Nipple Type: Nfant Extra Slow Flow (gold) ? ?LATCH Score ?Latch: Repeated attempts needed to sustain latch, nipple held in mouth throughout feeding, stimulation needed to elicit sucking reflex. (Mom hx of PCOS with widely spaced nipples. Mom noted breast changes with dry specks of colostrum in her bra during pregnancy) ? ?Audible Swallowing: None ? ?Type of Nipple: Everted at rest and after stimulation ? ?Comfort (Breast/Nipple): Soft / non-tender ? ?Hold (Positioning): Assistance needed to correctly position infant at breast and maintain latch. ? ?LATCH Score: 6 ? ? ?Lactation Tools Discussed/Used ?Tools: Pump;Flanges ?Flange Size: 21 ?Breast pump type: Double-Electric Breast  Pump ?Pump Education: Setup, frequency, and cleaning;Milk Storage ?Reason for Pumping: increase stimulation ?Pumping frequency: eery 3 hrs for 15 min ? ?Interventions ?Interventions: Breast feeding basics reviewed;Assisted with latch;Skin to skin;Breast massage;Hand express;Breast compression;Adjust position;Support pillows;Position options;Expressed milk;DEBP;Education;Pace feeding;LC Psychologist, educational;Infant Driven Feeding Algorithm education ? ?Discharge ?Pump: Personal;DEBP ? ?Consult Status ?Consult Status: Follow-up ?Date: 09/18/21 ?Follow-up type: In-patient ? ? ? ?Brooke Paulhus  Haley ?09/17/2021, 3:49 PM ? ? ? ?

## 2021-09-17 NOTE — Progress Notes (Signed)
?  No n/v, sob/cp; pain controlled, nml lochia, ambulating, breastfeeding; tol po ?Voids w/o difficulty; +flatus ?So excited for boy/girl - and both doing well ?+edema but fingers not as tight, legs not tight ? ?Patient Vitals for the past 24 hrs: ? BP Temp Temp src Pulse Resp SpO2  ?09/17/21 0444 -- -- -- -- -- 100 %  ?09/16/21 2300 127/75 97.8 ?F (36.6 ?C) Axillary 77 18 --  ?09/16/21 2038 121/80 -- -- 83 18 97 %  ?09/16/21 1915 124/78 98.7 ?F (37.1 ?C) -- 67 18 96 %  ?09/16/21 1805 (!) 144/86 98.2 ?F (36.8 ?C) Axillary 68 19 98 %  ?09/16/21 1710 132/71 (!) 97.5 ?F (36.4 ?C) Oral 65 18 98 %  ?09/16/21 1601 131/66 98.3 ?F (36.8 ?C) -- 65 18 98 %  ?09/16/21 1545 (!) 119/91 -- -- 65 15 98 %  ?09/16/21 1530 120/87 -- -- 69 (!) 21 98 %  ?09/16/21 1515 124/74 -- -- 72 (!) 23 98 %  ?09/16/21 1500 133/81 98.7 ?F (37.1 ?C) -- 74 18 98 %  ? ?Intake/Output Summary (Last 24 hours) at 09/17/2021 0950 ?Last data filed at 09/17/2021 0400 ?Gross per 24 hour  ?Intake 700 ml  ?Output 2078 ml  ?Net -1378 ml  ? ? ? ?A&ox3 ?Nml respirations ?Abd: soft, nd,nt, +bs; fundus firm and below umb; dressing intact/prevena; edema low abdomen at small pannus ?LE: +1-+2 LE edema ? ? ?  Latest Ref Rng & Units 09/17/2021  ?  4:58 AM 09/16/2021  ? 10:04 AM 08/31/2020  ?  8:58 AM  ?CBC  ?WBC 4.0 - 10.5 K/uL 16.2   10.5   9.4    ?Hemoglobin 12.0 - 15.0 g/dL 8.7   10.1   14.3    ?Hematocrit 36.0 - 46.0 % 25.6   29.5   42.7    ?Platelets 150 - 400 K/uL 296   281   363.0    ? ?A/P: pod 1 s/p ltcs for di/di/ twins, ghtn ?Doing well, contin current care ?Anemia of pregnancy with acute blood loss - asymptomatic, plan iv iron and then oral q day ?Rh pos ?RI ?Boy/girl, desires circ for boy - plan tomorrow d/t feeding issues; breastfeeding/formula ?LE edema, edema of pannus - plan hctz x3 days ?

## 2021-09-18 LAB — COMPREHENSIVE METABOLIC PANEL
ALT: 17 U/L (ref 0–44)
AST: 27 U/L (ref 15–41)
Albumin: 2.2 g/dL — ABNORMAL LOW (ref 3.5–5.0)
Alkaline Phosphatase: 83 U/L (ref 38–126)
Anion gap: 6 (ref 5–15)
BUN: 7 mg/dL (ref 6–20)
CO2: 23 mmol/L (ref 22–32)
Calcium: 8.4 mg/dL — ABNORMAL LOW (ref 8.9–10.3)
Chloride: 110 mmol/L (ref 98–111)
Creatinine, Ser: 0.56 mg/dL (ref 0.44–1.00)
GFR, Estimated: >60 mL/min (ref >60)
Glucose, Bld: 89 mg/dL (ref 70–99)
Potassium: 4.1 mmol/L (ref 3.5–5.1)
Sodium: 139 mmol/L (ref 135–145)
Total Bilirubin: 0.6 mg/dL (ref 0.3–1.2)
Total Protein: 5 g/dL — ABNORMAL LOW (ref 6.5–8.1)

## 2021-09-18 LAB — CBC WITH DIFFERENTIAL/PLATELET
Abs Immature Granulocytes: 0.54 10*3/uL — ABNORMAL HIGH (ref 0.00–0.07)
Basophils Absolute: 0 10*3/uL (ref 0.0–0.1)
Basophils Relative: 0 %
Eosinophils Absolute: 0.1 10*3/uL (ref 0.0–0.5)
Eosinophils Relative: 1 %
HCT: 24.1 % — ABNORMAL LOW (ref 36.0–46.0)
Hemoglobin: 8.1 g/dL — ABNORMAL LOW (ref 12.0–15.0)
Immature Granulocytes: 4 %
Lymphocytes Relative: 18 %
Lymphs Abs: 2.1 10*3/uL (ref 0.7–4.0)
MCH: 23.6 pg — ABNORMAL LOW (ref 26.0–34.0)
MCHC: 33.6 g/dL (ref 30.0–36.0)
MCV: 70.3 fL — ABNORMAL LOW (ref 80.0–100.0)
Monocytes Absolute: 0.7 10*3/uL (ref 0.1–1.0)
Monocytes Relative: 6 %
Neutro Abs: 8.7 10*3/uL — ABNORMAL HIGH (ref 1.7–7.7)
Neutrophils Relative %: 71 %
Platelets: 294 10*3/uL (ref 150–400)
RBC: 3.43 MIL/uL — ABNORMAL LOW (ref 3.87–5.11)
RDW: 16.3 % — ABNORMAL HIGH (ref 11.5–15.5)
WBC: 12.3 10*3/uL — ABNORMAL HIGH (ref 4.0–10.5)
nRBC: 0.5 % — ABNORMAL HIGH (ref 0.0–0.2)

## 2021-09-18 MED ORDER — OXYCODONE HCL 5 MG PO TABS: 5.0000 mg | ORAL_TABLET | ORAL | 0 refills | Status: DC | PRN

## 2021-09-18 MED ORDER — IBUPROFEN 600 MG PO TABS: 600.0000 mg | ORAL_TABLET | Freq: Four times a day (QID) | ORAL | 1 refills | Status: DC

## 2021-09-18 MED ORDER — ACETAMINOPHEN 500 MG PO TABS: 1000.0000 mg | ORAL_TABLET | Freq: Four times a day (QID) | ORAL | 1 refills | Status: DC | PRN

## 2021-09-18 NOTE — Lactation Note (Signed)
This note was copied from a baby's chart. ?Lactation Consultation Note ? ?Patient Name: Brooke Haley ?Today's Date: 09/18/2021 ?Reason for consult: Follow-up assessment;Primapara;1st time breastfeeding;Early term 37-38.6wks;Infant weight loss ?Age:28 hours ?Per mom Baby B recently fed. LC reviewed doc flow sheets.  ?See Baby A chart for details of consult.  ? ? ?Maternal Data ?  ? ?Feeding ?Mother's Current Feeding Choice: Breast Milk and Formula ?Nipple Type: Nfant Standard Flow (white) ? ?LATCH Score ?  ? ?  ? ?  ? ?  ? ?  ? ?  ? ? ?Lactation Tools Discussed/Used ?  ? ?Interventions ?Interventions: Breast feeding basics reviewed;DEBP;Education;LC Services brochure ? ?Discharge ?Discharge Education: Engorgement and breast care;Warning signs for feeding baby ?Pump: Personal;DEBP ? ?Consult Status ?Consult Status: Complete ?Date: 09/18/21 ? ? ? ?Matilde Sprang Patric Vanpelt ?09/18/2021, 12:56 PM ? ? ? ?

## 2021-09-18 NOTE — Discharge Summary (Signed)
?   ?  OB Discharge Summary ? ?Patient Name: Brooke Haley ?DOB: [birth year] ?MRN: [id number] ? ?Date of admission: 09/16/2021 ?Delivering MD:  ?  ?Kaselyn, Nelsen [id number]  ?ALMQUIST, SUSAN E  ?  ?Tiarah, Desilets [id number]  ?ALMQUIST, SUSAN E  ? ?Date of discharge: 09/18/2021 ? ?Admitting diagnosis: Dichorionic diamniotic twin pregnancy [O30.049] ?Intrauterine pregnancy: [redacted]w[redacted]d     ?Secondary diagnosis:Principal Problem: ?  Postpartum care following cesarean delivery 5/11 ?Active Problems: ?  Dichorionic diamniotic twin pregnancy ?  Cesarean delivery - Twins, DiDi ?  Gestational hypertension ? ?Additional problems:acute blood loss anemia  ?   ?Discharge diagnosis: Term Pregnancy Delivered       ?                                                              ?Post partum procedures: none ? ?Augmentation: N/A ? ?Complications: None ? ?Hospital course:  Sceduled C/S   26 y.o. yo G1P1002 at [redacted]w[redacted]d was admitted to the hospital 09/16/2021 for scheduled cesarean section with the following indication:Multifetal Gestation and gestational hypertension .Delivery details are as follows:  ?Membrane Rupture Time/Date:  ?  ?Louvella, Bentham [id number]  ?1:15 PM  ?  ?Deannie, Parreira [id number]  ?1:17 PM , ?  ?Azlee, Boesen [id number]  ?09/16/2021  ?  ?Keyna, Fallen [id number]  ?09/16/2021   ?Delivery Method: ?  ?Kylina, Bauernfeind [id number]  ?C-Section, Low Transverse  ?  ?Rhyannon, Barrineau [id number]  ?C-Section, Low Transverse  ?Details of operation can be found in separate operative note.  Patient had an uncomplicated postpartum course.  She is ambulating, tolerating a regular diet, passing flatus, and urinating well. Patient is discharged home in stable condition on  09/18/21 ? ?On day of d/c pt notes no CP, no SOB, ambulating without dizziness. She report minimal lochia and pain controlled w/o narcotic medication. No trouble with urination. Tolerating regular po. No RUQ  pain.  ? ?Pt's hgb was followed and continued to trend down but no evidence active surgical bleeding and pt with stable vitals, no symptoms of anemia and normal Cr despite slow drop in Hgb. IV iron given yesterday and pt expresses feeling well today. Precautions reviewed.  ?       ?Newborn Data: ?Birth date: ?  ?Lochlynn, Kusek [id number]  ?09/16/2021  ?  ?Corynn, Gorsky [id number]  ?09/16/2021  ?Birth time: ?  ?Yaneris, Cappo [id number]  ?1:16 PM  ?  ?Anaih, Boskovich [id number]  ?1:18 PM  ?Gender: ?  ?Ashna, Sandvik [id number]  ?Female  ?  ?Devorah, Llamas [id number]  ?Female  ?Living status: ?  ?Amarra, Aeschliman [id number]  ?Living  ?  ?Cerita, Greggs [id number]  ?Living  ?Apgars: ?  ?Alise, Harps [id number]  ?8  ?  ?Kween, Angrisani [id number] , ?  ?Rhealynn, Edwards [id number]  ?9  ?  ?Felecia, Nard [id number]  ?Weight: ?  ?Phelicia, Ladwig [id number]  ?2820 g  ?  ?Krystel, Meduna [id number]  ?2665 g    ? ?Physical exam  ?Vitals:  ? 09/17/21 1218 09/17/21 2019 09/17/21 2130 09/18/21 0530  ?BP: 130/79 127/90 139/67 135/76  ?Pulse: 77 77  97  ?Resp: 18 18  19  ?Temp: 98.3 ?F (36.8 ?C)     ?TempSrc: Oral     ?SpO2:  99%    ?Weight:      ?Height:      ? ?General: alert and cooperative, obese ?Lochia: appropriate ?Uterine Fundus: NT ?Abd: no RUQ pain, no CVAT ?Incision: Healing well with no significant drainage, wound vac in place ?DVT Evaluation: No evidence of DVT seen on physical exam. ?LE: 2+ edema, no clonus, 1+ DTR ?Labs: ?Lab Results  ?Component Value Date  ? WBC 12.3 (H) 09/18/2021  ? HGB 8.1 (L) 09/18/2021  ? HCT 24.1 (L) 09/18/2021  ? MCV 70.3 (L) 09/18/2021  ? PLT 294 09/18/2021  ? ? ?  Latest Ref Rng & Units 09/18/2021  ? 10:50 AM 09/17/2021  ?  4:58 AM 09/16/2021  ? 10:04 AM  ?CBC  ?WBC 4.0 - 10.5 K/uL 12.3   16.2   10.5    ?Hemoglobin 12.0 - 15.0 g/dL 8.1   8.7   10.1    ?Hematocrit 36.0 - 46.0  % 24.1   25.6   29.5    ?Platelets 150 - 400 K/uL 294   296   281    ? ? ? ?  Latest Ref Rng & Units 09/18/2021  ? 10:50 AM  ?CMP  ?Glucose 70 - 99 mg/dL 89    ?BUN 6 - 20 mg/dL 7    ?Creatinine 0.44 - 1.00 mg/dL 0.56    ?Sodium 135 - 145 mmol/L 139    ?Potassium 3.5 - 5.1 mmol/L 4.1    ?Chloride 98 - 111 mmol/L 110    ?CO2 22 - 32 mmol/L 23    ?Calcium 8.9 - 10.3 mg/dL 8.4    ?Total Protein 6.5 - 8.1 g/dL 5.0    ?Total Bilirubin 0.3 - 1.2 mg/dL 0.6    ?Alkaline Phos 38 - 126 U/L 83    ?AST 15 - 41 U/L 27    ?ALT 0 - 44 U/L 17    ? ? ?Discharge instruction: per After Visit Summary and "Baby and Me Booklet". ? ?After Visit Meds:  ?Allergies as of 09/18/2021   ?No Known Allergies ?  ? ?  ?Medication List  ?  ? ?STOP taking these medications   ? ?aspirin EC 81 MG tablet ?  ? ?  ? ?TAKE these medications   ? ?acetaminophen 500 MG tablet ?Commonly known as: TYLENOL ?Take 2 tablets (1,000 mg total) by mouth every 6 (six) hours as needed for mild pain. ?  ?folic acid 1 MG tablet ?Commonly known as: FOLVITE ?Take 1 mg by mouth daily. ?  ?ibuprofen 600 MG tablet ?Commonly known as: ADVIL ?Take 1 tablet (600 mg total) by mouth every 6 (six) hours. ?  ?oxyCODONE 5 MG immediate release tablet ?Commonly known as: Oxy IR/ROXICODONE ?Take 1 tablet (5 mg total) by mouth every 4 (four) hours as needed for moderate pain. ?  ?Prenatal Multi +DHA 27-0.8-200 MG Caps ?Take 1 tablet by mouth daily. ?  ?PROBIOTIC PO ?Take 1 tablet by mouth daily. ?  ? ?  ? ? ?Diet: routine diet and extra iron, plan iron Qoad ? ?Activity: Advance as tolerated. Pelvic rest for 6 weeks.  ? ?Outpatient follow up:6 weeks for routine, 1 wk for bp check ?Follow up Appt: ?Future Appointments  ?Date Time Provider [place]  ?11/11/2021  8:00 AM Salvadore Dom, MD [facility] None  ? ?Follow up visit: No follow-ups on file. ? ?Postpartum contraception: Not Discussed ? ?  Newborn Data: ?  ?Vidhya, Huscher [id number]  ?Live born female  ?Birth Weight: 6 lb  3.5 oz (2820 g) ?APGAR: 8, 9 ? ?Newborn Delivery   ?Birth date/time: 09/16/2021 13:16:00 ?Delivery type: C-Section, Low Transverse ?Trial of labor: No ?C-section categorization: Primary ?  ?  ?  ?Dalyla, Goans [id number]  ?Live born female  ?Birth Weight: 5 lb 14 oz (2665 g) ?APGAR: 8, 9 ? ?Newborn Delivery   ?Birth date/time: 09/16/2021 13:18:00 ?Delivery type: C-Section, Low Transverse ?Trial of labor: No ?C-section categorization: Primary ?  ?  ? ?Baby Feeding:  unsure ?Disposition: home with mother x 2 ? ? ?09/18/2021 ?Ala Dach, MD ? ? ?

## 2021-09-18 NOTE — Progress Notes (Signed)
Switched preveena negative pressure dressing pump to portable pump. Maudry Diego Driscoll ? ?

## 2021-09-18 NOTE — Lactation Note (Signed)
This note was copied from a baby's chart. ?Lactation Consultation Note ? ?Patient Name: Brooke Haley ?Today's Date: 09/18/2021 ?Reason for consult: Follow-up assessment;Multiple gestation;Infant < 6lbs;Infant weight loss;Other (Comment) (3 % weight loss . per mom- I have not pumped much in the last 24 hours. Considering to just pump when I get home. LC reviewed supply and demand/ importance of at least 8 times a day both breast for 10 -15 mins , save milk for the next feeding.) ?Age:29 hours ?LC recommended if when her milk comes in,and has a desire to working on latching to call for [facility] O/P appt.  ?LC reviewed to increase the volume gradually for the baby to stretch the the stomach to enhance growth.  ?Mom has the [facility] resource sheet with phone numbers.  ? ?Maternal Data ?  ? ?Feeding ?Mother's Current Feeding Choice: Breast Milk and Formula ?Nipple Type: Nfant Standard Flow (white) ? ?LATCH Score ?  ? ?  ? ?  ? ?  ? ?  ? ?  ? ? ?Lactation Tools Discussed/Used ?  ? ?Interventions ?Interventions: Breast feeding basics reviewed;Education;DEBP;LC Services brochure ? ?Discharge ?Discharge Education: Engorgement and breast care;Warning signs for feeding baby ?Pump: DEBP;Personal ? ?Consult Status ?Consult Status: Complete ?Date: 09/18/21 ? ? ? ?Matilde Sprang Mcihael Hinderman ?09/18/2021, 12:54 PM ? ? ? ?

## 2021-09-19 ENCOUNTER — Inpatient Hospital Stay (HOSPITAL_COMMUNITY): Admit: 2021-09-19 | Payer: No Typology Code available for payment source | Admitting: Obstetrics and Gynecology

## 2021-09-20 ENCOUNTER — Other Ambulatory Visit (HOSPITAL_COMMUNITY)
Admission: RE | Admit: 2021-09-20 | Discharge: 2021-09-20 | Disposition: A | Discharge status: Home / Self Care | Payer: No Typology Code available for payment source | Source: Ambulatory Visit | Attending: Obstetrics and Gynecology | Admitting: Obstetrics and Gynecology

## 2021-09-20 LAB — SURGICAL PATHOLOGY

## 2021-09-27 ENCOUNTER — Telehealth (HOSPITAL_COMMUNITY): Payer: Self-pay | Admitting: *Deleted

## 2021-09-27 NOTE — Telephone Encounter (Signed)
Left phone voicemail message.  Duffy Rhody, RN 09-27-2021 at 9:05am

## 2021-10-21 ENCOUNTER — Encounter (HOSPITAL_COMMUNITY): Payer: Self-pay

## 2021-11-02 NOTE — Progress Notes (Deleted)
26 y.o. G1P1002 Married American [place] or [place] Native Not Hispanic or Latino female here for annual exam.      No LMP recorded.          Sexually active: {yes no:314532}  The current method of family planning is {contraception:315051}.    Exercising: {yes no:314532}  {types:19826} Smoker:  {YES [id number]  Health Maintenance: Pap:  08/15/2018  History of abnormal Pap:  no MMG:  none  BMD:   none  Colonoscopy: none TDaP:  09/11/20  Gardasil: complete    reports that she has never smoked. She has never used smokeless tobacco. She reports current alcohol use. She reports that she does not use drugs.  Past Medical History:  Diagnosis Date   Pityriasis versicolor    RSV (respiratory syncytial virus pneumonia) 05/09/1994   In-patient    Past Surgical History:  Procedure Laterality Date   BLADDER SURGERY  05/09/1994   to straighten ureter   CESAREAN SECTION MULTI-GESTATIONAL N/A 09/16/2021   Procedure: Primary CESAREAN SECTION MULTI-GESTATIONAL;  Surgeon: Vick Frees, MD;  Location: [facility] LD ORS;  Service: Obstetrics;  Laterality: N/A;  EDD: 10/03/21   EYE SURGERY  05/09/1994   BILATERAL; amblyopia   KIDNEY SURGERY  1995   cystectomy & ureter repair   WISDOM TOOTH EXTRACTION      Current Outpatient Medications  Medication Sig Dispense Refill   acetaminophen (TYLENOL) 500 MG tablet Take 2 tablets (1,000 mg total) by mouth every 6 (six) hours as needed for mild pain. 60 tablet 1   folic acid (FOLVITE) 1 MG tablet Take 1 mg by mouth daily.     ibuprofen (ADVIL) 600 MG tablet Take 1 tablet (600 mg total) by mouth every 6 (six) hours. 60 tablet 1   oxyCODONE (OXY IR/ROXICODONE) 5 MG immediate release tablet Take 1 tablet (5 mg total) by mouth every 4 (four) hours as needed for moderate pain. 30 tablet 0   Prenatal MV-Min-Fe Fum-FA-DHA (PRENATAL MULTI +DHA) 27-0.8-200 MG CAPS Take 1 tablet by mouth daily.     Probiotic Product (PROBIOTIC PO) Take 1 tablet by mouth daily.     No  current facility-administered medications for this visit.    Family History  Problem Relation Age of Onset   Brain cancer Mother    Cancer Paternal Grandmother        Lung cancer    Review of Systems  Exam:   There were no vitals taken for this visit.  Weight change: @WEIGHTCHANGE @ Height:      Ht Readings from Last 3 Encounters:  09/10/21 4\' 11"  (1.499 m)  09/16/21 4\' 11"  (1.499 m)  11/10/20 4\' 11"  (1.499 m)    General appearance: alert, cooperative and appears stated age Head: Normocephalic, without obvious abnormality, atraumatic Neck: no adenopathy, supple, symmetrical, trachea midline and thyroid {CHL AMB PHY EX THYROID NORM [phone]::"normal to inspection and palpation"} Lungs: clear to auscultation bilaterally Cardiovascular: regular rate and rhythm Breasts: {Exam; breast:13139::"normal appearance, no masses or tenderness"} Abdomen: soft, non-tender; non distended,  no masses,  no organomegaly Extremities: extremities normal, atraumatic, no cyanosis or edema Skin: Skin color, texture, turgor normal. No rashes or lesions Lymph nodes: Cervical, supraclavicular, and axillary nodes normal. No abnormal inguinal nodes palpated Neurologic: Grossly normal   Pelvic: External genitalia:  no lesions              Urethra:  normal appearing urethra with no masses, tenderness or lesions  Bartholins and Skenes: normal                 Vagina: normal appearing vagina with normal color and discharge, no lesions              Cervix: {CHL AMB PHY EX CERVIX NORM [phone]::"no lesions"}               Bimanual Exam:  Uterus:  {CHL AMB PHY EX UTERUS NORM DEFAULT:[phone]::"normal size, contour, position, consistency, mobility, non-tender"}              Adnexa: {CHL AMB PHY EX ADNEXA NO MASS DEFAULT:[id number]::"no mass, fullness, tenderness"}               Rectovaginal: Confirms               Anus:  normal sphincter tone, no lesions  *** chaperoned for  the exam.  A:  Well Woman with normal exam  P:

## 2021-11-11 ENCOUNTER — Ambulatory Visit: Payer: BC Managed Care – PPO | Admitting: Obstetrics and Gynecology

## 2022-05-28 ENCOUNTER — Other Ambulatory Visit: Payer: Self-pay | Admitting: Family Medicine

## 2022-05-28 DIAGNOSIS — D649 Anemia, unspecified: Secondary | ICD-10-CM

## 2022-06-02 ENCOUNTER — Other Ambulatory Visit (INDEPENDENT_AMBULATORY_CARE_PROVIDER_SITE_OTHER): Payer: No Typology Code available for payment source

## 2022-06-02 DIAGNOSIS — D649 Anemia, unspecified: Secondary | ICD-10-CM

## 2022-06-02 LAB — COMPREHENSIVE METABOLIC PANEL
ALT: 10 U/L (ref 0–35)
AST: 14 U/L (ref 0–37)
Albumin: 4.4 g/dL (ref 3.5–5.2)
Alkaline Phosphatase: 61 U/L (ref 39–117)
BUN: 18 mg/dL (ref 6–23)
CO2: 22 mEq/L (ref 19–32)
Calcium: 9 mg/dL (ref 8.4–10.5)
Chloride: 106 mEq/L (ref 96–112)
Creatinine, Ser: 0.62 mg/dL (ref 0.40–1.20)
GFR: 121.39 mL/min (ref >60.00)
Glucose, Bld: 95 mg/dL (ref 70–99)
Potassium: 4.2 mEq/L (ref 3.5–5.1)
Sodium: 138 mEq/L (ref 135–145)
Total Bilirubin: 0.5 mg/dL (ref 0.2–1.2)
Total Protein: 7.5 g/dL (ref 6.0–8.3)

## 2022-06-02 LAB — CBC WITH DIFFERENTIAL/PLATELET
Basophils Absolute: 0 10*3/uL (ref 0.0–0.1)
Basophils Relative: 0.4 % (ref 0.0–3.0)
Eosinophils Absolute: 0.1 10*3/uL (ref 0.0–0.7)
Eosinophils Relative: 1.3 % (ref 0.0–5.0)
HCT: 43.7 % (ref 36.0–46.0)
Hemoglobin: 15.1 g/dL — ABNORMAL HIGH (ref 12.0–15.0)
Lymphocytes Relative: 34.8 % (ref 12.0–46.0)
Lymphs Abs: 2.7 10*3/uL (ref 0.7–4.0)
MCHC: 34.5 g/dL (ref 30.0–36.0)
MCV: 73.3 fl — ABNORMAL LOW (ref 78.0–100.0)
Monocytes Absolute: 0.4 10*3/uL (ref 0.1–1.0)
Monocytes Relative: 4.8 % (ref 3.0–12.0)
Neutro Abs: 4.5 10*3/uL (ref 1.4–7.7)
Neutrophils Relative %: 58.7 % (ref 43.0–77.0)
Platelets: 407 10*3/uL — ABNORMAL HIGH (ref 150.0–400.0)
RBC: 5.96 Mil/uL — ABNORMAL HIGH (ref 3.87–5.11)
RDW: 14.6 % (ref 11.5–15.5)
WBC: 7.7 10*3/uL (ref 4.0–10.5)

## 2022-06-09 ENCOUNTER — Ambulatory Visit: Payer: No Typology Code available for payment source | Admitting: Family Medicine

## 2022-06-16 ENCOUNTER — Encounter: Payer: Self-pay | Admitting: Family Medicine

## 2022-06-16 ENCOUNTER — Ambulatory Visit (INDEPENDENT_AMBULATORY_CARE_PROVIDER_SITE_OTHER): Payer: No Typology Code available for payment source | Admitting: Family Medicine

## 2022-06-16 VITALS — BP 120/78 | HR 71 | Temp 97.7°F | Ht 59.0 in | Wt 199.0 lb

## 2022-06-16 DIAGNOSIS — Z0000 Encounter for general adult medical examination without abnormal findings: Secondary | ICD-10-CM

## 2022-06-16 DIAGNOSIS — R41840 Attention and concentration deficit: Secondary | ICD-10-CM

## 2022-06-16 DIAGNOSIS — Z7189 Other specified counseling: Secondary | ICD-10-CM

## 2022-06-16 NOTE — Progress Notes (Signed)
CPE- See plan.  Routine anticipatory guidance given to patient.  See health maintenance.  The possibility exists that previously documented standard health maintenance information may have been brought forward from a previous encounter into this note.  If needed, that same information has been updated to reflect the current situation based on today's encounter.    Tdap 2022 PNA and shingles not due.  Flu shot due in the fall, d/w pt.   Covid vaccine done. Pap per gyn.  Colon cancer screening and breast cancer screening not due.  Advance directive d/w pt.  Husband designated if patient were incapacitated.   HIV and HCV screening done with pregnancy  She had HPV vaccine prev.   Diet and exercise d/w pt.  She is trying to start back with meal prep and balance her schedule.    Concentration difficulty d/w pt.  Sleep schedule d/w pt.  D/w pt about getting overwhelmed with decisions, procrastination.  "It's always been that way" but more noted since having kids.  She can get distracted more easily.  D/w pt about her focus in general- which can be high for individual issues but then can get overwhelmed.  D/w pt about counseling.    Gave birth to twins last year, d/w pt.    PMH and SH reviewed  Meds, vitals, and allergies reviewed.   ROS: Per HPI.  Unless specifically indicated otherwise in HPI, the patient denies:  General: fever. Eyes: acute vision changes ENT: sore throat Cardiovascular: chest pain Respiratory: SOB GI: vomiting GU: dysuria Musculoskeletal: acute back pain Derm: acute rash Neuro: acute motor dysfunction Psych: worsening mood Endocrine: polydipsia Heme: bleeding Allergy: hayfever  GEN: nad, alert and oriented HEENT: ncat NECK: supple w/o LA CV: rrr. PULM: ctab, no inc wob ABD: soft, +bs EXT: no edema SKIN: no acute rash

## 2022-06-16 NOTE — Patient Instructions (Addendum)
Let me know if you don't get a call about counseling.  Take care.  Glad to see you. Thanks for your effort.

## 2022-06-19 DIAGNOSIS — R41840 Attention and concentration deficit: Secondary | ICD-10-CM | POA: Insufficient documentation

## 2022-06-19 NOTE — Assessment & Plan Note (Signed)
Tdap 2022 PNA and shingles not due.  Flu shot due in the fall, d/w pt.   Covid vaccine done. Pap per gyn.  Colon cancer screening and breast cancer screening not due.  Advance directive d/w pt.  Husband designated if patient were incapacitated.   HIV and HCV screening done with pregnancy  She had HPV vaccine prev.   Diet and exercise d/w pt.  She is trying to start back with meal prep and balance her schedule.

## 2022-06-19 NOTE — Assessment & Plan Note (Signed)
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  I thanked her for her effort.  We talked about getting set up with counseling and I put in the order for that.  Okay for outpatient follow-up and she will let me know how it goes.

## 2022-06-19 NOTE — Assessment & Plan Note (Signed)
Advance directive- d/w pt. Husband designated if patient were incapacitated.
